# Patient Record
Sex: Male | Born: 1995 | Race: White | Hispanic: No | Marital: Single | Smoking: Current every day smoker
Health system: Southern US, Community
[De-identification: ages and names within clinical notes are randomized; demographics above are authoritative.]

## PROBLEM LIST (undated history)

## (undated) DIAGNOSIS — F319 Bipolar disorder, unspecified: Secondary | ICD-10-CM

## (undated) DIAGNOSIS — F3181 Bipolar II disorder: Secondary | ICD-10-CM

## (undated) DIAGNOSIS — R569 Unspecified convulsions: Secondary | ICD-10-CM

## (undated) DIAGNOSIS — F840 Autistic disorder: Secondary | ICD-10-CM

## (undated) DIAGNOSIS — F909 Attention-deficit hyperactivity disorder, unspecified type: Secondary | ICD-10-CM

## (undated) HISTORY — PX: HIP SURGERY: SHX245

---
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
## 2017-05-31 ENCOUNTER — Ambulatory Visit: Payer: Medicaid Other | Admitting: Family Medicine

## 2017-07-19 ENCOUNTER — Encounter (HOSPITAL_COMMUNITY): Payer: Self-pay | Admitting: *Deleted

## 2017-07-19 ENCOUNTER — Emergency Department (HOSPITAL_COMMUNITY): Payer: Medicaid Other

## 2017-07-19 ENCOUNTER — Emergency Department (HOSPITAL_COMMUNITY)
Admission: EM | Admit: 2017-07-19 | Discharge: 2017-07-19 | Disposition: A | Discharge status: Home / Self Care | Payer: Medicaid Other | Attending: Emergency Medicine | Admitting: Emergency Medicine

## 2017-07-19 DIAGNOSIS — G4489 Other headache syndrome: Secondary | ICD-10-CM | POA: Diagnosis not present

## 2017-07-19 DIAGNOSIS — R072 Precordial pain: Secondary | ICD-10-CM | POA: Diagnosis not present

## 2017-07-19 DIAGNOSIS — F17210 Nicotine dependence, cigarettes, uncomplicated: Secondary | ICD-10-CM | POA: Diagnosis not present

## 2017-07-19 DIAGNOSIS — R1084 Generalized abdominal pain: Secondary | ICD-10-CM | POA: Diagnosis present

## 2017-07-19 DIAGNOSIS — E860 Dehydration: Secondary | ICD-10-CM | POA: Insufficient documentation

## 2017-07-19 DIAGNOSIS — F1210 Cannabis abuse, uncomplicated: Secondary | ICD-10-CM | POA: Diagnosis not present

## 2017-07-19 LAB — URINALYSIS, ROUTINE W REFLEX MICROSCOPIC
BACTERIA UA: NONE SEEN
Bilirubin Urine: NEGATIVE
GLUCOSE, UA: NEGATIVE mg/dL
KETONES UR: 5 mg/dL — AB
Leukocytes, UA: NEGATIVE
NITRITE: NEGATIVE
PROTEIN: 30 mg/dL — AB
SQUAMOUS EPITHELIAL / LPF: NONE SEEN
Specific Gravity, Urine: 1.03 (ref 1.005–1.030)
pH: 5 (ref 5.0–8.0)

## 2017-07-19 LAB — I-STAT CHEM 8, ED
BUN: 14 mg/dL (ref 6–20)
CHLORIDE: 102 mmol/L (ref 101–111)
Calcium, Ion: 1.13 mmol/L — ABNORMAL LOW (ref 1.15–1.40)
Creatinine, Ser: 1 mg/dL (ref 0.61–1.24)
GLUCOSE: 94 mg/dL (ref 65–99)
HEMATOCRIT: 42 % (ref 39.0–52.0)
Hemoglobin: 14.3 g/dL (ref 13.0–17.0)
POTASSIUM: 3.3 mmol/L — AB (ref 3.5–5.1)
SODIUM: 141 mmol/L (ref 135–145)
TCO2: 27 mmol/L (ref 22–32)

## 2017-07-19 LAB — RAPID URINE DRUG SCREEN, HOSP PERFORMED
AMPHETAMINES: NOT DETECTED
BENZODIAZEPINES: NOT DETECTED
Barbiturates: NOT DETECTED
Cocaine: NOT DETECTED
OPIATES: NOT DETECTED
TETRAHYDROCANNABINOL: POSITIVE — AB

## 2017-07-19 MED ORDER — ALBUTEROL SULFATE HFA 108 (90 BASE) MCG/ACT IN AERS
2.0000 | INHALATION_SPRAY | Freq: Once | RESPIRATORY_TRACT | Status: DC
  Filled 2017-07-19: qty 6.7

## 2017-07-19 NOTE — Progress Notes (Signed)
Notified Dr. Bebe Shaggy of pt refusal

## 2017-07-19 NOTE — ED Provider Notes (Signed)
[facility]-EMERGENCY DEPT Provider Note   CSN: [id number] Arrival date & time: 07/19/17  0042     History   Chief Complaint Chief Complaint  Patient presents with  . Abdominal Pain    HPI Jeffrey Hunt is a 21 y.o. male.  The history is provided by the patient.  Abdominal Pain   This is a chronic problem. Episode onset: unknown. The problem has not changed since onset.The pain is located in the generalized abdominal region. The pain is mild. Associated symptoms include diarrhea and headaches. Pertinent negatives include fever and vomiting. The symptoms are aggravated by certain positions. Nothing relieves the symptoms.  Patient with presents with multiple complaints:  1. He reports pain throughout his abdomen, unknown when this started.  He reports recent nonbloody diarrhea 2. He reports pain on both sides of chest, but mostly on right side of his chest and he points to one spot that hurts the most. 3. When asked about headache he states "where do I begin" and tells me he has headaches due to other people. 4. He also reports back pain 5.he also reports shortness of breath with exertion  He reports smoking marijuana  He denies SI but reports told nurse he has thought of it previously but denies active SI or suicidal plan at this time  No recent falls/trauma Past Medical History:  Diagnosis Date  . ADHD (attention deficit hyperactivity disorder)   . Bipolar 1 disorder (HCC)   . Seizures (HCC)    when younger    Patient Active Problem List   Diagnosis Date Noted  . Allergic rhinitis 02/07/2013  . Mood disorder (HCC) 02/07/2013  . ADHD (attention deficit hyperactivity disorder) 02/07/2013    History reviewed. No pertinent surgical history.     Home Medications    Prior to Admission medications   Medication Sig Start Date End Date Taking? Authorizing Provider  ibuprofen (ADVIL,MOTRIN) 600 MG tablet Take 1 tablet (600 mg total) by mouth every 6 (six) hours as needed.  10/31/16   Ivery Quale, PA-C    Family History History reviewed. No pertinent family history.  Social History Social History  Substance Use Topics  . Smoking status: Current Every Day Smoker    Packs/day: 0.50    Types: Cigarettes  . Smokeless tobacco: Former Neurosurgeon    Types: Snuff  . Alcohol use No     Allergies   Stimulant laxative [bisacodyl]   Review of Systems Review of Systems  Constitutional: Negative for fever.  Respiratory: Positive for shortness of breath.   Cardiovascular: Positive for chest pain.  Gastrointestinal: Positive for abdominal pain and diarrhea. Negative for vomiting.  Neurological: Positive for headaches.  Psychiatric/Behavioral: Negative for suicidal ideas.  All other systems reviewed and are negative.    Physical Exam Updated Vital Signs BP 118/74 (BP Location: Left Arm)   Pulse (!) 102   Temp 98.3 F (36.8 C) (Oral)   Resp 18   Ht 1.778 m ( )   SpO2 95%   Physical Exam CONSTITUTIONAL: Well developed/well nourished HEAD: Normocephalic/atraumatic EYES: EOMI/PERRL, no icterus ENMT: Mucous membranes moist NECK: supple no meningeal signs SPINE/BACK:entire spine nontender CV: S1/S2 noted, no murmurs/rubs/gallops noted LUNGS: wheezing bilaterally, no distress noted Chest - mild diffuse tenderness, no bruising or crepitus ABDOMEN: soft, nontender, no rebound or guarding, bowel sounds noted throughout abdomen GU:no cva tenderness NEURO: Pt is awake/alert/appropriate, moves all extremitiesx4.  No facial droop.  He ambulates without difficulty, no ataxia noted EXTREMITIES: pulses normal/equal, full ROM, no  LE edema or calf tenderness SKIN: warm, color normal PSYCH: no abnormalities of mood noted, alert and oriented to situation   ED Treatments / Results  Labs (all labs ordered are listed, but only abnormal results are displayed) Labs Reviewed  RAPID URINE DRUG SCREEN, HOSP PERFORMED - Abnormal; Notable for the following:        Result Value   Tetrahydrocannabinol POSITIVE (*)    All other components within normal limits  URINALYSIS, ROUTINE W REFLEX MICROSCOPIC - Abnormal; Notable for the following:    APPearance HAZY (*)    Hgb urine dipstick LARGE (*)    Ketones, ur 5 (*)    Protein, ur 30 (*)    All other components within normal limits  I-STAT CHEM 8, ED - Abnormal; Notable for the following:    Potassium 3.3 (*)    Calcium, Ion 1.13 (*)    All other components within normal limits    EKG  EKG Interpretation  Date/Time:  Tuesday July 19 2017 01:25:17 EDT Ventricular Rate:  96 PR Interval:    QRS Duration: 84 QT Interval:  320 QTC Calculation: 405 R Axis:   87 Text Interpretation:  Sinus rhythm Consider left atrial enlargement No significant change since last tracing Confirmed by Zadie Rhine ([id number]) on 07/19/2017 1:57:33 AM       Radiology Dg Chest 2 View  Result Date: 07/19/2017 CLINICAL DATA:  Cough, dyspnea and chest pain tonight. EXAM: CHEST  2 VIEW COMPARISON:  08/17/2005 FINDINGS: Linear opacities in both bases may represent scarring or atelectasis. No confluent airspace consolidation. No pleural effusion. Normal hilar, mediastinal and cardiac contours. IMPRESSION: Linear lung base opacities bilaterally, scarring versus atelectasis. No confluent consolidation. Electronically Signed   By: Ellery Plunk M.D.   On: 07/19/2017 02:21    Procedures Procedures  Medications Ordered in ED Medications  albuterol (PROVENTIL HFA;VENTOLIN HFA) 108 (90 Base) MCG/ACT inhaler 2 puff (2 puffs Inhalation Not Given 07/19/17 0153)     Initial Impression / Assessment and Plan / ED Course  I have reviewed the triage vital signs and the nursing notes.  Pertinent labs & imaging results that were available during my care of the patient were reviewed by me and considered in my medical decision making (see chart for details).     2:00 AM Pt with multiple complaints Overall, he is well  appearing Anticipate discharge once labs/imaging result He admits he just wanted to "get checked out" to make sure he didn't have cancer Advised he can f/u as outpatient for further testing Pt refused albuterol, states it gives him rash  Advised PCP followup Told him to increase PO fluids as he is dehydrated Also advised he may have nephrolithiasis causing hematuria, though no acute pain at this time  Final Clinical Impressions(s) / ED Diagnoses   Final diagnoses:  Precordial pain  Dehydration  Other headache syndrome  Marijuana abuse    New Prescriptions New Prescriptions   No medications on file     Zadie Rhine, MD 07/19/17 [phone]

## 2017-07-19 NOTE — ED Triage Notes (Signed)
Pt has multiple complaints; pt c/o left side rib pain and lower back pain for the last 3 days

## 2017-07-19 NOTE — Progress Notes (Signed)
Pt refused inhaler. Pt states that he is allergic to the medication. He states his face breaks out.

## 2017-10-31 ENCOUNTER — Emergency Department (HOSPITAL_COMMUNITY)
Admission: EM | Admit: 2017-10-31 | Discharge: 2017-10-31 | Disposition: A | Discharge status: Home / Self Care | Payer: Medicaid Other | Attending: Emergency Medicine | Admitting: Emergency Medicine

## 2017-10-31 ENCOUNTER — Encounter (HOSPITAL_COMMUNITY): Payer: Self-pay | Admitting: Emergency Medicine

## 2017-10-31 ENCOUNTER — Other Ambulatory Visit: Payer: Self-pay

## 2017-10-31 DIAGNOSIS — Z5321 Procedure and treatment not carried out due to patient leaving prior to being seen by health care provider: Secondary | ICD-10-CM | POA: Diagnosis not present

## 2017-10-31 DIAGNOSIS — R42 Dizziness and giddiness: Secondary | ICD-10-CM | POA: Insufficient documentation

## 2017-10-31 LAB — BASIC METABOLIC PANEL
Anion gap: 5 (ref 5–15)
BUN: 11 mg/dL (ref 6–20)
CHLORIDE: 102 mmol/L (ref 101–111)
CO2: 30 mmol/L (ref 22–32)
Calcium: 8.9 mg/dL (ref 8.9–10.3)
Creatinine, Ser: 1.15 mg/dL (ref 0.61–1.24)
GFR calc Af Amer: >60 mL/min (ref >60)
GFR calc non Af Amer: >60 mL/min (ref >60)
GLUCOSE: 98 mg/dL (ref 65–99)
POTASSIUM: 5.7 mmol/L — AB (ref 3.5–5.1)
Sodium: 137 mmol/L (ref 135–145)

## 2017-10-31 LAB — CBC
HEMATOCRIT: 45.8 % (ref 39.0–52.0)
Hemoglobin: 15.2 g/dL (ref 13.0–17.0)
MCH: 28.5 pg (ref 26.0–34.0)
MCHC: 33.2 g/dL (ref 30.0–36.0)
MCV: 85.8 fL (ref 78.0–100.0)
Platelets: 180 10*3/uL (ref 150–400)
RBC: 5.34 MIL/uL (ref 4.22–5.81)
RDW: 13.5 % (ref 11.5–15.5)
WBC: 5.3 10*3/uL (ref 4.0–10.5)

## 2017-10-31 LAB — CBG MONITORING, ED: Glucose-Capillary: 95 mg/dL (ref 65–99)

## 2017-10-31 NOTE — ED Triage Notes (Signed)
Pt verbalizes lightheadedness/dizziness while walking into [employer]; verbalize "blood pressure and pulse up." Pt verbalizes blood pressure was 127 systolic and 114 heart rate.

## 2018-01-10 ENCOUNTER — Emergency Department (HOSPITAL_COMMUNITY): Payer: Medicaid Other

## 2018-01-10 ENCOUNTER — Emergency Department (HOSPITAL_COMMUNITY)
Admission: EM | Admit: 2018-01-10 | Discharge: 2018-01-10 | Disposition: A | Discharge status: Home / Self Care | Payer: Medicaid Other | Attending: Emergency Medicine | Admitting: Emergency Medicine

## 2018-01-10 ENCOUNTER — Other Ambulatory Visit: Payer: Self-pay

## 2018-01-10 ENCOUNTER — Encounter (HOSPITAL_COMMUNITY): Payer: Self-pay | Admitting: Emergency Medicine

## 2018-01-10 DIAGNOSIS — F17210 Nicotine dependence, cigarettes, uncomplicated: Secondary | ICD-10-CM | POA: Diagnosis not present

## 2018-01-10 DIAGNOSIS — R51 Headache: Secondary | ICD-10-CM | POA: Insufficient documentation

## 2018-01-10 DIAGNOSIS — S0990XA Unspecified injury of head, initial encounter: Secondary | ICD-10-CM

## 2018-01-10 HISTORY — DX: Bipolar II disorder: F31.81

## 2018-01-10 HISTORY — DX: Autistic disorder: F84.0

## 2018-01-10 MED ORDER — ACETAMINOPHEN 500 MG PO TABS
1000.0000 mg | ORAL_TABLET | Freq: Once | ORAL | Status: AC
  Administered 2018-01-10: 1000 mg via ORAL
  Filled 2018-01-10: qty 2

## 2018-01-10 NOTE — Discharge Instructions (Signed)
Take Tylenol as directed for pain.  Call the local health department to get a primary care physician.  Ask your primary care physician to help you to stop smoking

## 2018-01-10 NOTE — ED Triage Notes (Signed)
Onset Saturday, when working outside, pt stood up and hit top of head on an air conditioner. Continue to have headache and vomiting.

## 2018-01-10 NOTE — ED Notes (Signed)
Patient transported to CT 

## 2018-01-10 NOTE — ED Provider Notes (Signed)
[facility] EMERGENCY DEPARTMENT Provider Note   CSN: [id number] Arrival date & time: 01/10/18  1457     History   Chief Complaint Chief Complaint  Patient presents with  . Head Injury    HPI Jeffrey Hunt is a 22 y.o. male.  Patient stood up from a squatting position hitting the top of his head on an air conditioner wall unit 3 nights ago.  He developed a severe headache at the top of his head the following morning accompanied by vomiting 2 or 3 times.  Headache is gotten progressively worse since its onset.  Nothing makes symptoms better or worse.  No treatment prior to coming here he is not nauseated at present.  He denies neck pain denies focal numbness or weakness denies visual changes or changes in hearing.  No other associated symptoms  HPI  Past Medical History:  Diagnosis Date  . ADHD (attention deficit hyperactivity disorder)   . Autistic disorder   . Bipolar 1 disorder (HCC)   . Bipolar 2 disorder (HCC)   . Seizures (HCC)    when younger    Patient Active Problem List   Diagnosis Date Noted  . Allergic rhinitis 02/07/2013  . Mood disorder (HCC) 02/07/2013  . ADHD (attention deficit hyperactivity disorder) 02/07/2013    History reviewed. No pertinent surgical history.      Home Medications    Prior to Admission medications   Medication Sig Start Date End Date Taking? Authorizing Provider  ibuprofen (ADVIL,MOTRIN) 600 MG tablet Take 1 tablet (600 mg total) by mouth every 6 (six) hours as needed. 10/31/16   Ivery Quale, PA-C    Family History No family history on file.  Social History Social History   Tobacco Use  . Smoking status: Current Every Day Smoker    Packs/day: 0.25    Types: Cigarettes  . Smokeless tobacco: Former Neurosurgeon    Types: Snuff  Substance Use Topics  . Alcohol use: No  . Drug use: Not Currently    Comment: 4 months ago     Allergies   Stimulant laxative [bisacodyl]   Review of Systems Review of Systems    Constitutional: Negative.   HENT: Negative.   Respiratory: Negative.   Cardiovascular: Negative.   Gastrointestinal: Positive for vomiting.  Musculoskeletal: Negative.   Skin: Negative.   Neurological: Positive for headaches.  Psychiatric/Behavioral: Negative.      Physical Exam Updated Vital Signs BP 126/80 (BP Location: Right Arm)   Pulse 70   Temp 98.1 F (36.7 C) (Oral)   Resp 18   Ht 5\' 9"  (1.753 m)   Wt 62.1 kg (137 lb)   SpO2 99%   BMI 20.23 kg/m   Physical Exam  Constitutional: He is oriented to person, place, and time. He appears well-developed and well-nourished. No distress.  HENT:  Head: Normocephalic and atraumatic.  Right Ear: External ear normal.  Left Ear: External ear normal.  Nose: Nose normal.  Bilateral tympanic membranes normal  Eyes: Pupils are equal, round, and reactive to light. Conjunctivae and EOM are normal.  Neck: Neck supple. No tracheal deviation present. No thyromegaly present.  No tenderness over cervical spine  Cardiovascular: Normal rate, regular rhythm and intact distal pulses.  No murmur heard. Pulmonary/Chest: Effort normal and breath sounds normal.  Abdominal: Soft. Bowel sounds are normal. He exhibits no distension. There is no tenderness.  Musculoskeletal: Normal range of motion. He exhibits no edema or tenderness.  All 4 extremities nontender, neurovascular intact  Neurological:  He is alert and oriented to person, place, and time. Coordination normal.  Finger to nose normal motor strength 5/5 overall gait normal Romberg normal pronator drift normal.  Cranial nerves II through XII grossly intact  Skin: Skin is warm and dry. No rash noted.  Psychiatric: He has a normal mood and affect.  Nursing note and vitals reviewed.    ED Treatments / Results  Labs (all labs ordered are listed, but only abnormal results are displayed) Labs Reviewed - No data to display  EKG None  Radiology No results found. Results for orders  placed or performed during the hospital encounter of 07/19/17  Rapid urine drug screen (hospital performed)  Result Value Ref Range   Opiates NONE DETECTED NONE DETECTED   Cocaine NONE DETECTED NONE DETECTED   Benzodiazepines NONE DETECTED NONE DETECTED   Amphetamines NONE DETECTED NONE DETECTED   Tetrahydrocannabinol POSITIVE (A) NONE DETECTED   Barbiturates NONE DETECTED NONE DETECTED  Urinalysis, Routine w reflex microscopic  Result Value Ref Range   Color, Urine YELLOW YELLOW   APPearance HAZY (A) CLEAR   Specific Gravity, Urine 1.030 1.005 - 1.030   pH 5.0 5.0 - 8.0   Glucose, UA NEGATIVE NEGATIVE mg/dL   Hgb urine dipstick LARGE (A) NEGATIVE   Bilirubin Urine NEGATIVE NEGATIVE   Ketones, ur 5 (A) NEGATIVE mg/dL   Protein, ur 30 (A) NEGATIVE mg/dL   Nitrite NEGATIVE NEGATIVE   Leukocytes, UA NEGATIVE NEGATIVE   RBC / HPF TOO NUMEROUS TO COUNT 0 - 5 RBC/hpf   WBC, UA 0-5 0 - 5 WBC/hpf   Bacteria, UA NONE SEEN NONE SEEN   Squamous Epithelial / LPF NONE SEEN NONE SEEN   Mucus PRESENT    Ca Oxalate Crys, UA PRESENT   I-stat chem 8, ed  Result Value Ref Range   Sodium 141 135 - 145 mmol/L   Potassium 3.3 (L) 3.5 - 5.1 mmol/L   Chloride 102 101 - 111 mmol/L   BUN 14 6 - 20 mg/dL   Creatinine, Ser 9.601.00 0.61 - 1.24 mg/dL   Glucose, Bld 94 65 - 99 mg/dL   Calcium, Ion 4.541.13 (L) 1.15 - 1.40 mmol/L   TCO2 27 22 - 32 mmol/L   Hemoglobin 14.3 13.0 - 17.0 g/dL   HCT 09.842.0 11.939.0 - 14.752.0 %   Ct Head Wo Contrast  Result Date: 01/10/2018 CLINICAL DATA:  Posttraumatic headache EXAM: CT HEAD WITHOUT CONTRAST TECHNIQUE: Contiguous axial images were obtained from the base of the skull through the vertex without intravenous contrast. COMPARISON:  None. FINDINGS: Brain: No evidence of acute infarction, hemorrhage, hydrocephalus, extra-axial collection or mass lesion/mass effect. Vascular: No hyperdense vessel or unexpected calcification. Skull: No osseous abnormality. Sinuses/Orbits: Visualized  paranasal sinuses are clear. Visualized mastoid sinuses are clear. Visualized orbits demonstrate no focal abnormality. Other: None IMPRESSION: No acute intracranial pathology. Electronically Signed   By: Elige KoHetal  Patel   On: 01/10/2018 16:28   Procedures Procedures (including critical care time)  Medications Ordered in ED Medications  acetaminophen (TYLENOL) tablet 1,000 mg (1,000 mg Oral Given 01/10/18 1520)     Initial Impression / Assessment and Plan / ED Course  I have reviewed the triage vital signs and the nursing notes.  Pertinent labs & imaging results that were available during my care of the patient were reviewed by me and considered in my medical decision making (see chart for details).     4:50 PM pain improved after treatment with Tylenol.  He is alert  ambulatory Glasgow Coma Score 15 and appears in no distress Symptoms consistent with mild concussion.  Plan Tylenol.  I counseled patient for 5 minutes on smoking cessation.  Referral primary care patient does not play any contact sports and is not at any increased risk for reinjury Final Clinical Impressions(s) / ED Diagnoses  Diagnosis Minor closed head injury with mild concussion Final diagnoses:  None    ED Discharge Orders    None       Doug Sou, MD 01/10/18 2214

## 2018-01-15 IMAGING — DX DG FOOT COMPLETE 3+V*R*
3 series · 3 of 3 positions shown · non-contrast
Comparison: Right foot radiographs performed 10/22/2010

CLINICAL DATA: Laceration to the right foot after kicking [employer]. Initial encounter.

EXAM:
RIGHT FOOT COMPLETE - 3+ VIEW

[foot ap]
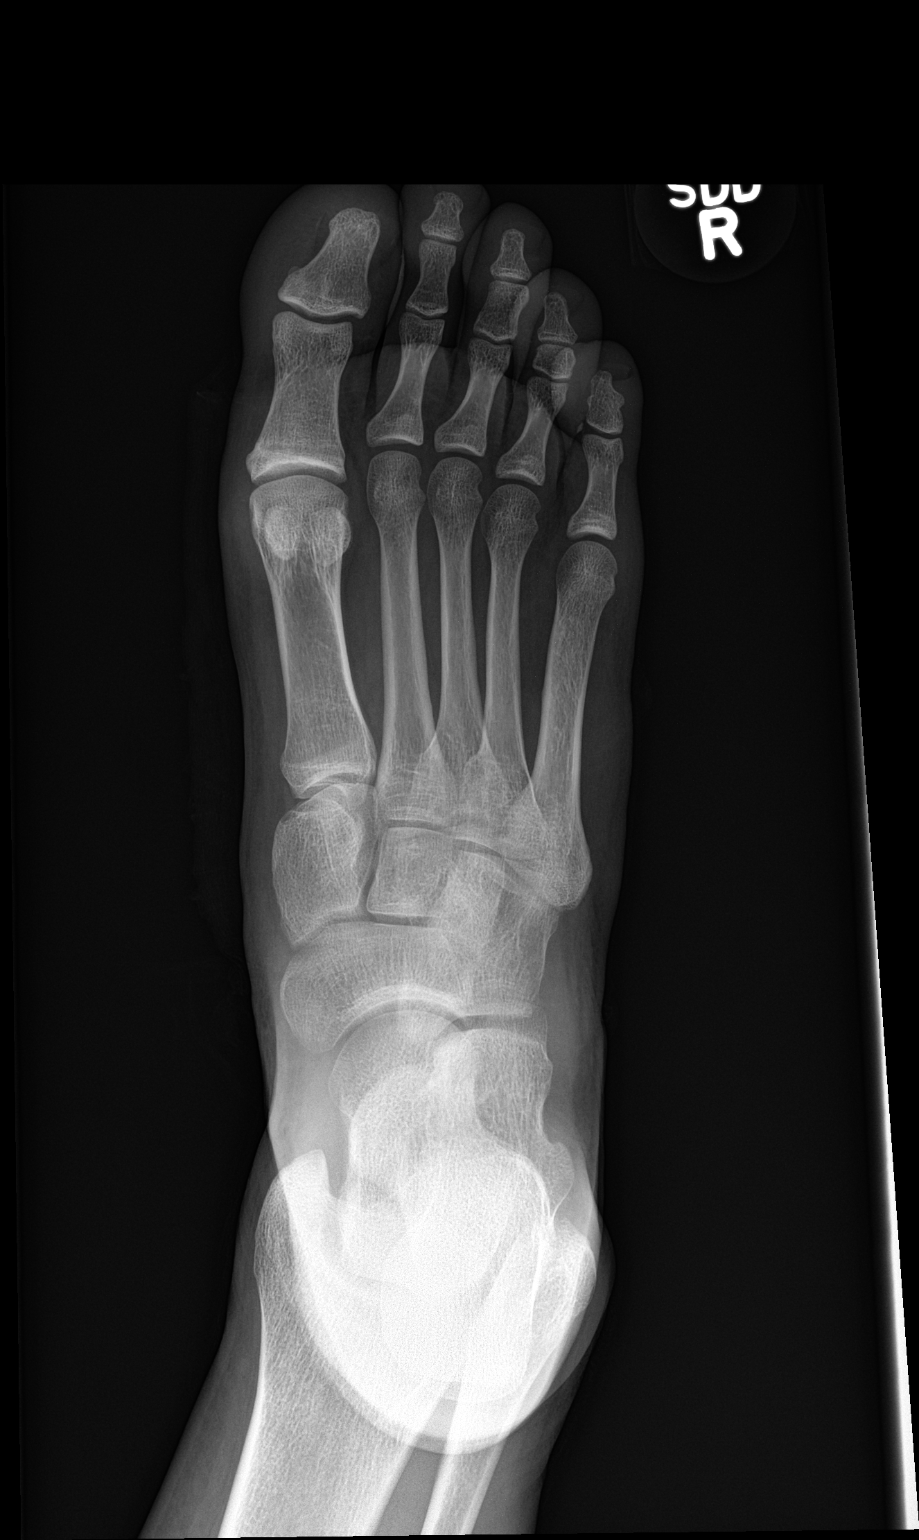

[foot obl]
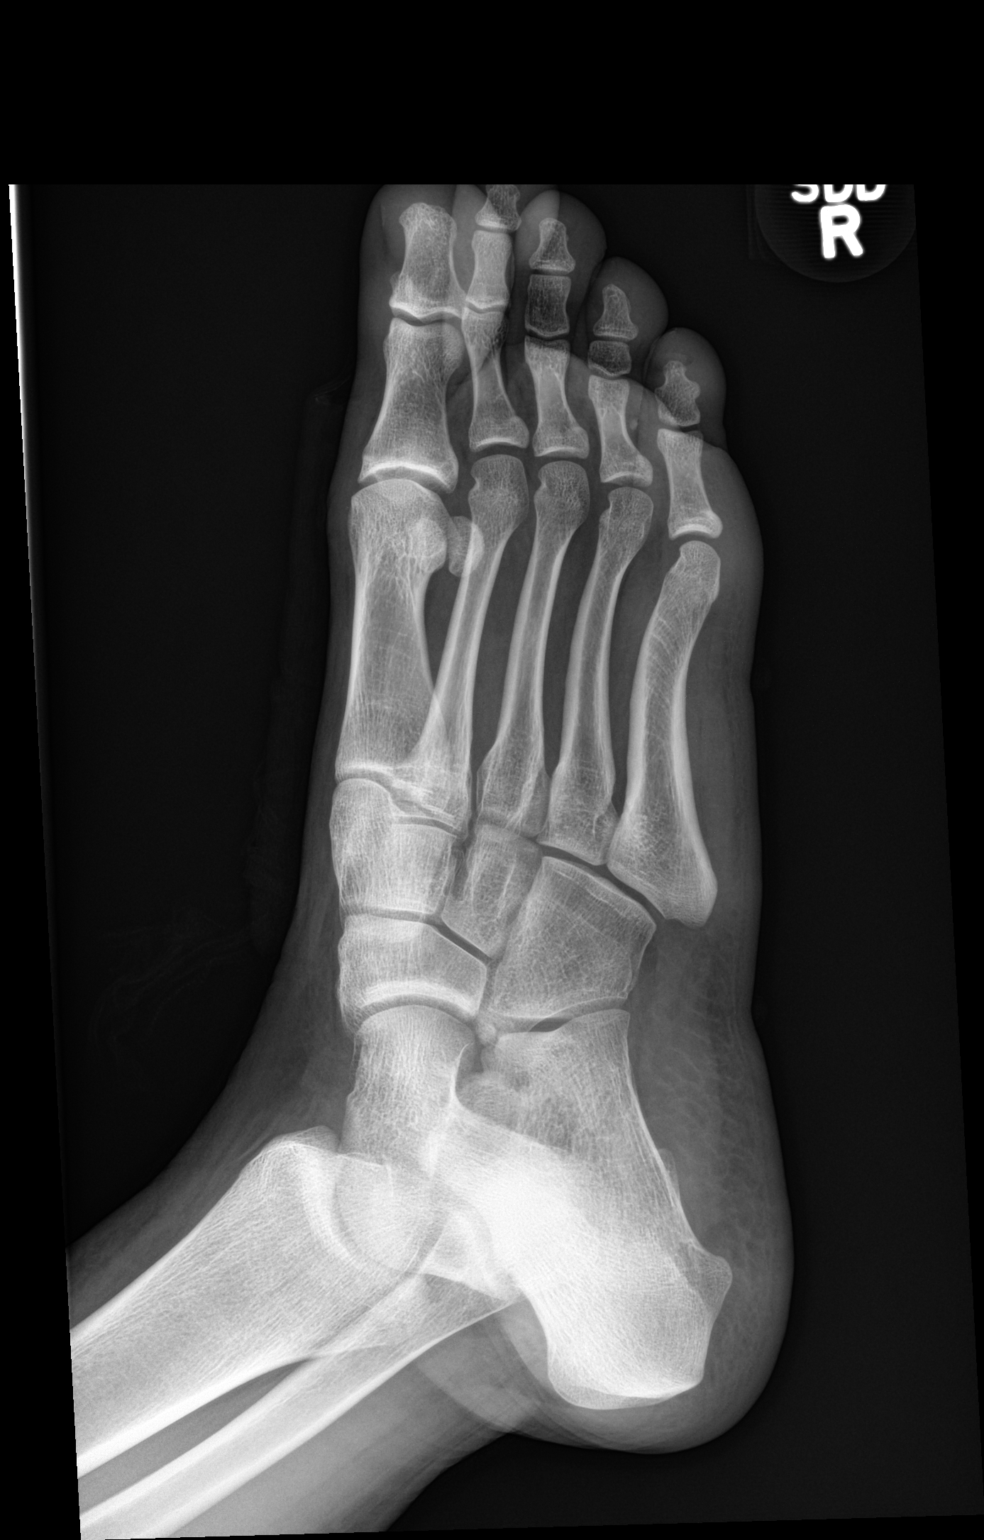

[foot lat]
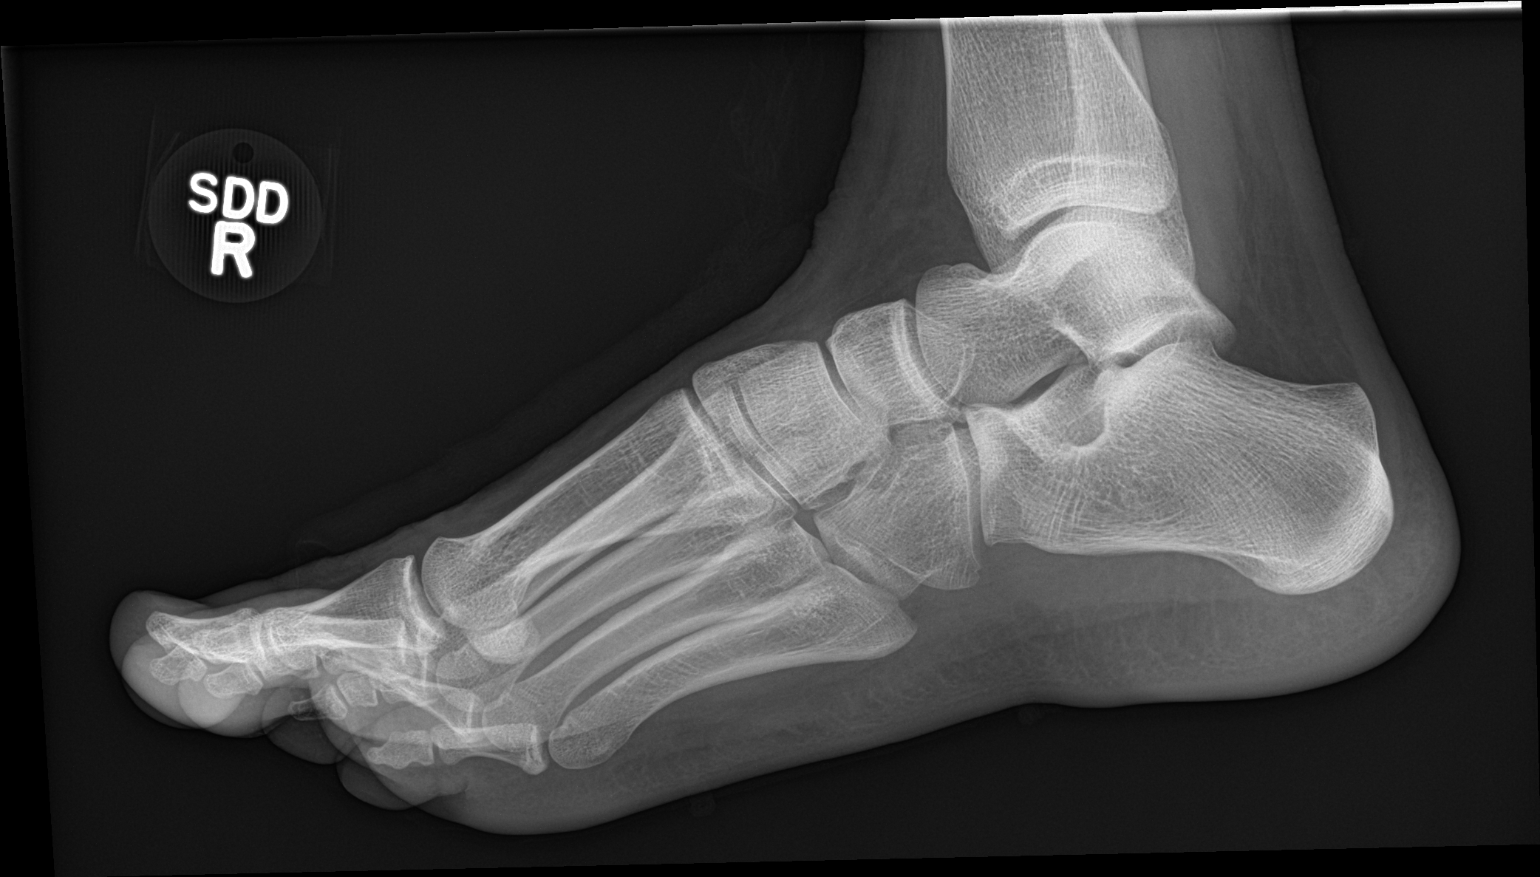

[3 of 3 positions shown; findings below may reference images not displayed]

FINDINGS: There is no evidence of fracture or dislocation. The joint spaces
are preserved. There is no evidence of talar subluxation; the
subtalar joint is unremarkable in appearance.

The known soft tissue laceration is not well characterized. No
radiopaque foreign bodies are seen.
IMPRESSION: No evidence of fracture or dislocation. No radiopaque foreign bodies
seen.

## 2018-03-06 ENCOUNTER — Emergency Department (HOSPITAL_COMMUNITY): Payer: Medicaid Other

## 2018-03-06 ENCOUNTER — Encounter (HOSPITAL_COMMUNITY): Payer: Self-pay | Admitting: *Deleted

## 2018-03-06 ENCOUNTER — Other Ambulatory Visit: Payer: Self-pay

## 2018-03-06 ENCOUNTER — Emergency Department (HOSPITAL_COMMUNITY)
Admission: EM | Admit: 2018-03-06 | Discharge: 2018-03-06 | Disposition: A | Discharge status: Home / Self Care | Payer: Medicaid Other | Attending: Emergency Medicine | Admitting: Emergency Medicine

## 2018-03-06 DIAGNOSIS — F17210 Nicotine dependence, cigarettes, uncomplicated: Secondary | ICD-10-CM | POA: Diagnosis not present

## 2018-03-06 DIAGNOSIS — F319 Bipolar disorder, unspecified: Secondary | ICD-10-CM | POA: Insufficient documentation

## 2018-03-06 DIAGNOSIS — F909 Attention-deficit hyperactivity disorder, unspecified type: Secondary | ICD-10-CM | POA: Insufficient documentation

## 2018-03-06 DIAGNOSIS — F840 Autistic disorder: Secondary | ICD-10-CM | POA: Insufficient documentation

## 2018-03-06 DIAGNOSIS — Z79899 Other long term (current) drug therapy: Secondary | ICD-10-CM | POA: Insufficient documentation

## 2018-03-06 DIAGNOSIS — R109 Unspecified abdominal pain: Secondary | ICD-10-CM | POA: Insufficient documentation

## 2018-03-06 LAB — COMPREHENSIVE METABOLIC PANEL
ALK PHOS: 77 U/L (ref 38–126)
ALT: 12 U/L — ABNORMAL LOW (ref 17–63)
ANION GAP: 9 (ref 5–15)
AST: 28 U/L (ref 15–41)
Albumin: 4.7 g/dL (ref 3.5–5.0)
BILIRUBIN TOTAL: 1 mg/dL (ref 0.3–1.2)
BUN: 13 mg/dL (ref 6–20)
CALCIUM: 9.4 mg/dL (ref 8.9–10.3)
CO2: 27 mmol/L (ref 22–32)
Chloride: 102 mmol/L (ref 101–111)
Creatinine, Ser: 0.99 mg/dL (ref 0.61–1.24)
Glucose, Bld: 97 mg/dL (ref 65–99)
Potassium: 3.7 mmol/L (ref 3.5–5.1)
Sodium: 138 mmol/L (ref 135–145)
TOTAL PROTEIN: 7.4 g/dL (ref 6.5–8.1)

## 2018-03-06 LAB — LIPASE, BLOOD: Lipase: 33 U/L (ref 11–51)

## 2018-03-06 LAB — CBC
HCT: 44.4 % (ref 39.0–52.0)
HEMOGLOBIN: 15 g/dL (ref 13.0–17.0)
MCH: 29.8 pg (ref 26.0–34.0)
MCHC: 33.8 g/dL (ref 30.0–36.0)
MCV: 88.3 fL (ref 78.0–100.0)
Platelets: 159 10*3/uL (ref 150–400)
RBC: 5.03 MIL/uL (ref 4.22–5.81)
RDW: 13.4 % (ref 11.5–15.5)
WBC: 9.5 10*3/uL (ref 4.0–10.5)

## 2018-03-06 LAB — URINALYSIS, ROUTINE W REFLEX MICROSCOPIC
Bilirubin Urine: NEGATIVE
Glucose, UA: NEGATIVE mg/dL
Hgb urine dipstick: NEGATIVE
Ketones, ur: 5 mg/dL — AB
LEUKOCYTES UA: NEGATIVE
NITRITE: NEGATIVE
Protein, ur: NEGATIVE mg/dL
SPECIFIC GRAVITY, URINE: 1.025 (ref 1.005–1.030)
pH: 5 (ref 5.0–8.0)

## 2018-03-06 LAB — RAPID URINE DRUG SCREEN, HOSP PERFORMED
Amphetamines: NOT DETECTED
BENZODIAZEPINES: NOT DETECTED
Barbiturates: NOT DETECTED
COCAINE: NOT DETECTED
OPIATES: NOT DETECTED
TETRAHYDROCANNABINOL: POSITIVE — AB

## 2018-03-06 MED ORDER — DOXYCYCLINE HYCLATE 100 MG PO CAPS: 100.0000 mg | ORAL_CAPSULE | Freq: Two times a day (BID) | ORAL | 0 refills | Status: DC

## 2018-03-06 MED ORDER — KETOROLAC TROMETHAMINE 30 MG/ML IJ SOLN
15.0000 mg | Freq: Once | INTRAMUSCULAR | Status: AC
  Administered 2018-03-06: 15 mg via INTRAVENOUS
  Filled 2018-03-06: qty 1

## 2018-03-06 MED ORDER — SODIUM CHLORIDE 0.9 % IV BOLUS
1000.0000 mL | Freq: Once | INTRAVENOUS | Status: AC
  Administered 2018-03-06: 1000 mL via INTRAVENOUS

## 2018-03-06 MED ORDER — IOPAMIDOL (ISOVUE-300) INJECTION 61%
100.0000 mL | Freq: Once | INTRAVENOUS | Status: AC | PRN
  Administered 2018-03-06: 100 mL via INTRAVENOUS

## 2018-03-06 NOTE — ED Notes (Signed)
Patient transported to CT 

## 2018-03-06 NOTE — Discharge Instructions (Addendum)
Take ibuprofen for pain. Avoid acetaminophen for now. Follow up with the health department for the results of your hepatitis screen.Take the antibiotics until gone. Return to the ED if you get a fever, vomiting or seem worse.

## 2018-03-06 NOTE — ED Triage Notes (Signed)
Pt c/o right side abdominal pain for "a while"; pt states he has been vomiting

## 2018-03-06 NOTE — ED Provider Notes (Signed)
[facility] EMERGENCY DEPARTMENT Provider Note   CSN: [id number] Arrival date & time: 03/06/18  0865  Time seen 05:39 AM   History   Chief Complaint Chief Complaint  Patient presents with  . Abdominal Pain    HPI Jeffrey Hunt is a 22 y.o. male.  HPI patient states he is having lateral right sided abdominal pain that is "killing me" for a few days or up to a week.  He states it got worse tonight.  The pain is been there constantly.  The process of sitting up makes the pain worse, nothing else does.  Nothing makes it feel better.  He tried 2 Tylenol yesterday without improvement.  He has had nausea and vomiting about 4-5 times a day.  He denies diarrhea.  He states it hurts in his right side when he urinates.  He denies hematuria or fever.  He also states he has had a cough for a few months and produces phlegm but he does not look at.  He states he is short of breath all the time.  Patient states he has lack of energy.  He states his pain is different from when he had kidney stones.  PC[facility], [facility]   Past Medical History:  Diagnosis Date  . ADHD (attention deficit hyperactivity disorder)   . Autistic disorder   . Bipolar 1 disorder (HCC)   . Bipolar 2 disorder (HCC)   . Seizures (HCC)    when younger    Patient Active Problem List   Diagnosis Date Noted  . Allergic rhinitis 02/07/2013  . Mood disorder (HCC) 02/07/2013  . ADHD (attention deficit hyperactivity disorder) 02/07/2013    History reviewed. No pertinent surgical history.      Home Medications    Prior to Admission medications   Medication Sig Start Date End Date Taking? Authorizing Provider  doxycycline (VIBRAMYCIN) 100 MG capsule Take 1 capsule (100 mg total) by mouth 2 (two) times daily. 03/06/18   [name], MD  ibuprofen (ADVIL,MOTRIN) 600 MG tablet Take 1 tablet (600 mg total) by mouth every 6 (six) hours as needed. 10/31/16   Ivery Quale, PA-C    Family History History  reviewed. No pertinent family history.  Social History Social History   Tobacco Use  . Smoking status: Current Every Day Smoker    Packs/day: 1.00    Types: Cigarettes  . Smokeless tobacco: Former Neurosurgeon    Types: Snuff  Substance Use Topics  . Alcohol use: Yes    Comment: occasionally  . Drug use: Yes    Comment: CBD weed  states he is graduating next week, he wants to get his PhD and be a scientist   Allergies   Stimulant laxative [bisacodyl]   Review of Systems Review of Systems  All other systems reviewed and are negative.    Physical Exam Updated Vital Signs BP 109/66 (BP Location: Left Arm)   Pulse 79   Temp 97.7 F (36.5 C) (Oral)   Resp 16   Ht  (1.753 m)   Wt 54.9 kg (121 lb)   SpO2 100%   BMI 17.87 kg/m   Vital signs normal    Physical Exam  Constitutional: He is oriented to person, place, and time. He appears well-developed and well-nourished.  Non-toxic appearance. He does not appear ill. No distress.  HENT:  Head: Normocephalic and atraumatic.  Right Ear: External ear normal.  Left Ear: External ear normal.  Nose: Nose normal. No mucosal edema  or rhinorrhea.  Mouth/Throat: Oropharynx is clear and moist and mucous membranes are normal. No dental abscesses or uvula swelling.  Poor teeth  Eyes: Pupils are equal, round, and reactive to light. Conjunctivae and EOM are normal.  Neck: Normal range of motion and full passive range of motion without pain. Neck supple.  Cardiovascular: Normal rate, regular rhythm and normal heart sounds. Exam reveals no gallop and no friction rub.  No murmur heard. Pulmonary/Chest: Effort normal and breath sounds normal. No respiratory distress. He has no wheezes. He has no rhonchi. He has no rales. He exhibits no tenderness and no crepitus.  Abdominal: Soft. Normal appearance and bowel sounds are normal. He exhibits no distension. There is no tenderness. There is no rebound and no guarding.    Area pain noted and  in the back in the same area.   Musculoskeletal: Normal range of motion. He exhibits no edema or tenderness.  Moves all extremities well.   Neurological: He is alert and oriented to person, place, and time. He has normal strength. No cranial nerve deficit.  Skin: Skin is warm, dry and intact. No rash noted. No erythema. No pallor.  Psychiatric: He has a normal mood and affect. His speech is normal and behavior is normal. His mood appears not anxious.  Nursing note and vitals reviewed.    ED Treatments / Results  Labs (all labs ordered are listed, but only abnormal results are displayed) Results for orders placed or performed during the hospital encounter of 03/06/18  Lipase, blood  Result Value Ref Range   Lipase 33 11 - 51 U/L  Comprehensive metabolic panel  Result Value Ref Range   Sodium 138 135 - 145 mmol/L   Potassium 3.7 3.5 - 5.1 mmol/L   Chloride 102 101 - 111 mmol/L   CO2 27 22 - 32 mmol/L   Glucose, Bld 97 65 - 99 mg/dL   BUN 13 6 - 20 mg/dL   Creatinine, Ser 6.96 0.61 - 1.24 mg/dL   Calcium 9.4 8.9 - 29.5 mg/dL   Total Protein 7.4 6.5 - 8.1 g/dL   Albumin 4.7 3.5 - 5.0 g/dL   AST 28 15 - 41 U/L   ALT 12 (L) 17 - 63 U/L   Alkaline Phosphatase 77 38 - 126 U/L   Total Bilirubin 1.0 0.3 - 1.2 mg/dL   GFR calc non Af Amer >60 >60 mL/min   GFR calc Af Amer >60 >60 mL/min   Anion gap 9 5 - 15  CBC  Result Value Ref Range   WBC 9.5 4.0 - 10.5 K/uL   RBC 5.03 4.22 - 5.81 MIL/uL   Hemoglobin 15.0 13.0 - 17.0 g/dL   HCT 28.4 13.2 - 44.0 %   MCV 88.3 78.0 - 100.0 fL   MCH 29.8 26.0 - 34.0 pg   MCHC 33.8 30.0 - 36.0 g/dL   RDW 10.2 72.5 - 36.6 %   Platelets 159 150 - 400 K/uL  Urinalysis, Routine w reflex microscopic  Result Value Ref Range   Color, Urine YELLOW YELLOW   APPearance CLEAR CLEAR   Specific Gravity, Urine 1.025 1.005 - 1.030   pH 5.0 5.0 - 8.0   Glucose, UA NEGATIVE NEGATIVE mg/dL   Hgb urine dipstick NEGATIVE NEGATIVE   Bilirubin Urine NEGATIVE  NEGATIVE   Ketones, ur 5 (A) NEGATIVE mg/dL   Protein, ur NEGATIVE NEGATIVE mg/dL   Nitrite NEGATIVE NEGATIVE   Leukocytes, UA NEGATIVE NEGATIVE   Laboratory interpretation all normal  EKG None  Radiology Ct Abdomen Pelvis W Contrast  Result Date: 03/06/2018 CLINICAL DATA:  Right-sided abdominal pain and vomiting EXAM: CT ABDOMEN AND PELVIS WITH CONTRAST TECHNIQUE: Multidetector CT imaging of the abdomen and pelvis was performed using the standard protocol following bolus administration of intravenous contrast. CONTRAST:  ISOVUE-300 IOPAMIDOL (ISOVUE-300) INJECTION 61% COMPARISON:  None. FINDINGS: Lower chest: There is atelectatic change in the medial left base. No consolidation in the lung bases. Hepatobiliary: There is a probable hemangioma in the anterior segment right lobe of the liver near the fissure for the ligamentum teres measuring 2.4 x 1.2 cm. There is a second area of increased attenuation in the left lobe of the liver measuring 5.6 x 3.2 cm. This lesion most likely also represents a hemangioma in this age group. No other focal liver lesion evident. There is mild periportal edema. Gallbladder is contracted. There is no biliary duct dilatation. Pancreas: There is no evident pancreatic mass or inflammatory focus. Spleen: No splenic lesions are evident. Adrenals/Urinary Tract: Adrenals bilaterally appear unremarkable. Kidneys bilaterally show no evident mass or hydronephrosis on either side. There is no appreciable renal or ureteral calculus on either side. Urinary bladder is midline thickening of the urinary bladder wall diffusely. Stomach/Bowel: There is no appreciable bowel wall or mesenteric thickening. No evident bowel obstruction. There is no appreciable free air or portal venous air. Vascular/Lymphatic: No abdominal aortic aneurysm. No vascular lesions are evident. There is no appreciable adenopathy in the abdomen or pelvis. Reproductive: Prostate and seminal vesicles appear  normal in size and contour. No pelvic mass lesions are evident. There is diffuse enhancement of the corpora cavernosa bilaterally. No mass or abnormal fluid seen in this area. Other: Appendix appears unremarkable. No abscess or ascites evident in the abdomen or pelvis. Musculoskeletal: There are no blastic or lytic bone lesions. There is a 7 x 7 mm cyst in the rectus muscle on the right anteriorly. IMPRESSION: 1.  Urinary bladder wall thickening.  Suspect a degree of cystitis. 2. Somewhat diffuse enhancement of the corpora cavernosa bilaterally. Etiology for this finding is uncertain. Penile inflammatory change is possible, although there is no wall thickening or evidence suggesting abnormal fluid/abscess. No mass seen. 3. No renal or ureteral calculus. No hydronephrosis. Prostate appears unremarkable. 4.  No bowel obstruction.  No abscess.  Appendix appears normal. 5. 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 There is a degree of periportal edema. Appropriate laboratory correlation to assess for possible underlying parenchymal liver disease advised. No biliary duct dilatation evident. 6. Small cyst in the rectus abdominus muscle on the right anteriorly. Electronically Signed   By: Bretta Bang III M.D.   On: 03/06/2018 07:14    Procedures Procedures (including critical care time)  Medications Ordered in ED Medications  sodium chloride 0.9 % bolus 1,000 mL (1,000 mLs Intravenous New Bag/Given 03/06/18 0618)  ketorolac (TORADOL) 30 MG/ML injection 15 mg (15 mg Intravenous Given 03/06/18 0618)  iopamidol (ISOVUE-300) 61 % injection 100 mL (100 mLs Intravenous Contrast Given 03/06/18 0653)     Initial Impression / Assessment and Plan / ED Course  I have reviewed the triage vital signs and the nursing notes.  Pertinent labs & imaging results that were available during my care of the patient were reviewed by me and considered in my medical decision making (see chart for details).    Patient was given IV  fluids and IV Toradol for his pain.  His pain is very atypical, the location is not classic for gallbladder  or kidney problems.  He does report increasing pain when he urinates.  CT scan was done with contrast because patient is very thin and if he does not have a kidney stone it would make other etiologies of his abdominal pain easier to see.  Pt left at change of shift to get results of his CT scan.   Patient's pain is better after the Toradol.  We discussed his CT result.  Hepatitis screen was added.  Patient states that "my liver should be clean".  We discussed that the hepatitis screen will not be back today, he needs to get a primary care doctor to follow-up with.  He is going to be put on antibiotics for possible cystitis although his urine looked normal.  He also is aware of the small cyst in his abdominal wall which I do not think would be the source of his pain.   Final Clinical Impressions(s) / ED Diagnoses   Final diagnoses:  Right sided abdominal pain    Disposition pending  [name], MD, Concha Pyo, MD 03/06/18 [phone]

## 2018-03-08 LAB — HEPATITIS PANEL, ACUTE
HEP A IGM: NEGATIVE
HEP B C IGM: NEGATIVE
HEP B S AG: NEGATIVE

## 2018-03-09 ENCOUNTER — Encounter (INDEPENDENT_AMBULATORY_CARE_PROVIDER_SITE_OTHER): Payer: Self-pay | Admitting: Internal Medicine

## 2018-03-09 ENCOUNTER — Encounter (INDEPENDENT_AMBULATORY_CARE_PROVIDER_SITE_OTHER): Payer: Self-pay | Admitting: *Deleted

## 2018-03-09 ENCOUNTER — Ambulatory Visit (INDEPENDENT_AMBULATORY_CARE_PROVIDER_SITE_OTHER): Payer: Medicaid Other | Admitting: Internal Medicine

## 2018-03-09 VITALS — BP 104/70 | HR 72 | Temp 98.2°F | Ht 69.0 in | Wt 122.0 lb

## 2018-03-09 DIAGNOSIS — R109 Unspecified abdominal pain: Secondary | ICD-10-CM

## 2018-03-09 NOTE — Patient Instructions (Signed)
Labs today

## 2018-03-09 NOTE — Telephone Encounter (Signed)
Error   This encounter was created in error - please disregard. 

## 2018-03-09 NOTE — Progress Notes (Signed)
   Subjective:    Patient ID: Jeffrey Hunt, male    DOB: Sep 06, 1996, 22 y.o.   MRN: [id number]  HPI Here today for f/u after recent visit to the ED on 03/06/2018 for rt abdominal pain. The pain was in his rt flank. Pain x 1 week. States the pain is not as bad. Rates pain 5/10. The pain is worse at night. Pain not related to eating. There has been no injury.  Sitting made the pain worse. When he voids he has pain. No blood in stool or fever. No problems voiding.  No burning on urination per patient.  States he had a kidney stone a couple of months ago and he has passed it.  No nausea or vomiting since hospital visit.  Hx of bipolar and autism.  Has a BM x 3-4 a day or more in a day. Stools are formed most of the time.   CT scan 1.  Urinary bladder wall thickening.  Suspect a degree of cystitis.  2. Somewhat diffuse enhancement of the corpora cavernosa bilaterally. Etiology for this finding is uncertain. Penile inflammatory change is possible, although there is no wall thickening or evidence suggesting abnormal fluid/abscess. No mass seen.  3. No renal or ureteral calculus. No hydronephrosis. Prostate appears unremarkable.  4.  No bowel obstruction.  No abscess.  Appendix appears normal.  5. 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 There is a degree of periportal edema. Appropriate laboratory correlation to assess for possible underlying parenchymal liver disease advised. No biliary duct dilatation evident.  6. Small cyst in the rectus abdominus muscle on the right    Review of Systems Past Medical History:  Diagnosis Date  . ADHD (attention deficit hyperactivity disorder)   . Autistic disorder   . Bipolar 1 disorder (HCC)   . Bipolar 2 disorder (HCC)   . Seizures (HCC)    when younger    History reviewed. No pertinent surgical history.  Allergies  Allergen Reactions  . Other     Stimulants. He almost died he states when he was younger  . Stimulant Laxative  [Bisacodyl] Other (See Comments)    Seizure/coma    Current Outpatient Medications on File Prior to Visit  Medication Sig Dispense Refill  . doxycycline (VIBRAMYCIN) 100 MG capsule Take 1 capsule (100 mg total) by mouth 2 (two) times daily. 20 capsule 0   No current facility-administered medications on file prior to visit.         Objective:   Physical Exam Blood pressure 104/70, pulse 72, temperature 98.2 F (36.8 C), height  (1.753 m), weight 122 lb (55.3 kg). Alert and oriented. Skin warm and dry. Oral mucosa is moist.   . Sclera anicteric, conjunctivae is pink. Thyroid not enlarged. No cervical lymphadenopathy. Lungs clear. Heart regular rate and rhythm.  Abdomen is soft. Bowel sounds are positive. No hepatomegaly. No abdominal masses felt. No tenderness.  No edema to lower extremities.           Assessment & Plan:  Rt flank pain. ? Etiology. ? If this is GI.  Will get a CBC, sedrate, urinalysis, hepatic function.

## 2018-03-10 LAB — URINALYSIS
Bilirubin Urine: NEGATIVE
Glucose, UA: NEGATIVE
Ketones, ur: NEGATIVE
Leukocytes, UA: NEGATIVE
Nitrite: NEGATIVE
Protein, ur: NEGATIVE
Specific Gravity, Urine: 1.018 (ref 1.001–1.03)
pH: 6.5 (ref 5.0–8.0)

## 2018-03-10 LAB — CBC WITH DIFFERENTIAL/PLATELET
BASOS ABS: 21 {cells}/uL (ref 0–200)
BASOS PCT: 0.5 %
Eosinophils Absolute: 202 cells/uL (ref 15–500)
Eosinophils Relative: 4.8 %
HEMATOCRIT: 42.7 % (ref 38.5–50.0)
Hemoglobin: 14.4 g/dL (ref 13.2–17.1)
LYMPHS ABS: 1982 {cells}/uL (ref 850–3900)
MCH: 29.2 pg (ref 27.0–33.0)
MCHC: 33.7 g/dL (ref 32.0–36.0)
MCV: 86.6 fL (ref 80.0–100.0)
MPV: 11.2 fL (ref 7.5–12.5)
Monocytes Relative: 7.2 %
NEUTROS ABS: 1693 {cells}/uL (ref 1500–7800)
Neutrophils Relative %: 40.3 %
Platelets: 147 10*3/uL (ref 140–400)
RBC: 4.93 10*6/uL (ref 4.20–5.80)
RDW: 13 % (ref 11.0–15.0)
Total Lymphocyte: 47.2 %
WBC: 4.2 10*3/uL (ref 3.8–10.8)
WBCMIX: 302 {cells}/uL (ref 200–950)

## 2018-03-10 LAB — HEPATIC FUNCTION PANEL
AG Ratio: 2.4 (calc) (ref 1.0–2.5)
ALT: 9 U/L (ref 8–46)
AST: 20 U/L (ref 12–32)
Albumin: 4.5 g/dL (ref 3.6–5.1)
Alkaline phosphatase (APISO): 80 U/L (ref 48–230)
Bilirubin, Direct: 0.2 mg/dL (ref 0.0–0.2)
Globulin: 1.9 g/dL (calc) — ABNORMAL LOW (ref 2.1–3.5)
Indirect Bilirubin: 0.3 mg/dL (calc) (ref 0.2–1.1)
Total Bilirubin: 0.5 mg/dL (ref 0.2–1.1)
Total Protein: 6.4 g/dL (ref 6.3–8.2)

## 2018-03-10 LAB — SEDIMENTATION RATE: SED RATE: 2 mm/h (ref 0–15)

## 2019-03-10 ENCOUNTER — Other Ambulatory Visit: Payer: Self-pay

## 2019-03-10 DIAGNOSIS — F840 Autistic disorder: Secondary | ICD-10-CM | POA: Diagnosis not present

## 2019-03-10 DIAGNOSIS — F17210 Nicotine dependence, cigarettes, uncomplicated: Secondary | ICD-10-CM | POA: Diagnosis not present

## 2019-03-10 DIAGNOSIS — R1011 Right upper quadrant pain: Secondary | ICD-10-CM | POA: Insufficient documentation

## 2019-03-10 DIAGNOSIS — Z79899 Other long term (current) drug therapy: Secondary | ICD-10-CM | POA: Diagnosis not present

## 2019-03-11 ENCOUNTER — Encounter (HOSPITAL_COMMUNITY): Payer: Self-pay | Admitting: Emergency Medicine

## 2019-03-11 ENCOUNTER — Other Ambulatory Visit: Payer: Self-pay

## 2019-03-11 ENCOUNTER — Emergency Department (HOSPITAL_COMMUNITY)
Admission: EM | Admit: 2019-03-11 | Discharge: 2019-03-11 | Disposition: A | Discharge status: Home / Self Care | Payer: Medicaid Other | Attending: Emergency Medicine | Admitting: Emergency Medicine

## 2019-03-11 DIAGNOSIS — R1011 Right upper quadrant pain: Secondary | ICD-10-CM

## 2019-03-11 LAB — COMPREHENSIVE METABOLIC PANEL
ALT: 14 U/L (ref 0–44)
AST: 31 U/L (ref 15–41)
Albumin: 4.8 g/dL (ref 3.5–5.0)
Alkaline Phosphatase: 88 U/L (ref 38–126)
Anion gap: 11 (ref 5–15)
BUN: 8 mg/dL (ref 6–20)
CO2: 26 mmol/L (ref 22–32)
Calcium: 9.5 mg/dL (ref 8.9–10.3)
Chloride: 105 mmol/L (ref 98–111)
Creatinine, Ser: 0.91 mg/dL (ref 0.61–1.24)
GFR calc Af Amer: >60 mL/min (ref >60)
GFR calc non Af Amer: >60 mL/min (ref >60)
Glucose, Bld: 101 mg/dL — ABNORMAL HIGH (ref 70–99)
Potassium: 3.5 mmol/L (ref 3.5–5.1)
Sodium: 142 mmol/L (ref 135–145)
Total Bilirubin: 0.5 mg/dL (ref 0.3–1.2)
Total Protein: 7.8 g/dL (ref 6.5–8.1)

## 2019-03-11 LAB — CBC WITH DIFFERENTIAL/PLATELET
Abs Immature Granulocytes: 0.01 10*3/uL (ref 0.00–0.07)
Basophils Absolute: 0 10*3/uL (ref 0.0–0.1)
Basophils Relative: 0 %
Eosinophils Absolute: 0.2 10*3/uL (ref 0.0–0.5)
Eosinophils Relative: 2 %
HCT: 43.9 % (ref 39.0–52.0)
Hemoglobin: 14.7 g/dL (ref 13.0–17.0)
Immature Granulocytes: 0 %
Lymphocytes Relative: 42 %
Lymphs Abs: 3.4 10*3/uL (ref 0.7–4.0)
MCH: 29.3 pg (ref 26.0–34.0)
MCHC: 33.5 g/dL (ref 30.0–36.0)
MCV: 87.6 fL (ref 80.0–100.0)
Monocytes Absolute: 0.5 10*3/uL (ref 0.1–1.0)
Monocytes Relative: 7 %
Neutro Abs: 3.9 10*3/uL (ref 1.7–7.7)
Neutrophils Relative %: 49 %
Platelets: 170 10*3/uL (ref 150–400)
RBC: 5.01 MIL/uL (ref 4.22–5.81)
RDW: 13.2 % (ref 11.5–15.5)
WBC: 8 10*3/uL (ref 4.0–10.5)
nRBC: 0 % (ref 0.0–0.2)

## 2019-03-11 LAB — URINALYSIS, ROUTINE W REFLEX MICROSCOPIC
Bilirubin Urine: NEGATIVE
Glucose, UA: NEGATIVE mg/dL
Hgb urine dipstick: NEGATIVE
Ketones, ur: NEGATIVE mg/dL
Leukocytes,Ua: NEGATIVE
Nitrite: NEGATIVE
Protein, ur: NEGATIVE mg/dL
Specific Gravity, Urine: 1.021 (ref 1.005–1.030)
pH: 5 (ref 5.0–8.0)

## 2019-03-11 LAB — LIPASE, BLOOD: Lipase: 68 U/L — ABNORMAL HIGH (ref 11–51)

## 2019-03-11 MED ORDER — ONDANSETRON 4 MG PO TBDP: 4.0000 mg | ORAL_TABLET | Freq: Three times a day (TID) | ORAL | 0 refills | Status: DC | PRN

## 2019-03-11 MED ORDER — ONDANSETRON HCL 4 MG/2ML IJ SOLN
4.0000 mg | Freq: Once | INTRAMUSCULAR | Status: AC
  Administered 2019-03-11: 4 mg via INTRAVENOUS
  Filled 2019-03-11: qty 2

## 2019-03-11 MED ORDER — MORPHINE SULFATE (PF) 4 MG/ML IV SOLN
4.0000 mg | Freq: Once | INTRAVENOUS | Status: AC
  Administered 2019-03-11: 4 mg via INTRAVENOUS
  Filled 2019-03-11: qty 1

## 2019-03-11 NOTE — ED Notes (Signed)
Patient states that he was able to drink a small amount of ginger-ale.

## 2019-03-11 NOTE — ED Triage Notes (Signed)
Pt states he wants his liver checked out. States he had an issue with it before and is c/o RLQ abdomen /Flank pain for 2-3 days.

## 2019-03-11 NOTE — Discharge Instructions (Addendum)
You were seen today for right upper quadrant pain.  The cause of your pain at this time is unknown.  However, your work-up is reassuring.  Return later today for an ultrasound.  Take Zofran as needed for nausea and vomiting.

## 2019-03-11 NOTE — ED Provider Notes (Signed)
[facility] EMERGENCY DEPARTMENT Provider Note   CSN: [id number] Arrival date & time: 03/10/19  2357    History   Chief Complaint Chief Complaint  Patient presents with  . Flank Pain    HPI Jeffrey Hunt is a 23 y.o. male.     HPI  This is a 23 year old male with a history of autism, bipolar disorder who presents with right upper quadrant and epigastric pain.  Patient reports 2 to 3-day history of worsening pain.  He reports that it is sharp and nonradiating.  It is worse with food intake.  He reports nausea and vomiting with eating.  No diarrhea or constipation.  Denies fevers.  Reports "I need to get my liver checked out."  Reports prior liver abnormalities which he cannot describe.  Currently he rates his pain 8 out of 10.  He has not taken anything for the pain.  He denies any illicit drug use or alcohol use.  Denies shortness of breath, chest pain, dysuria.  Past Medical History:  Diagnosis Date  . ADHD (attention deficit hyperactivity disorder)   . Autistic disorder   . Bipolar 1 disorder (HCC)   . Bipolar 2 disorder (HCC)   . Seizures (HCC)    when younger    Patient Active Problem List   Diagnosis Date Noted  . Allergic rhinitis 02/07/2013  . Mood disorder (HCC) 02/07/2013  . ADHD (attention deficit hyperactivity disorder) 02/07/2013    History reviewed. No pertinent surgical history.      Home Medications    Prior to Admission medications   Medication Sig Start Date End Date Taking? Authorizing Provider  doxycycline (VIBRAMYCIN) 100 MG capsule Take 1 capsule (100 mg total) by mouth 2 (two) times daily. 03/06/18   Devoria AlbeKnapp, Iva, MD  mirtazapine (REMERON) 15 MG tablet Take 15 mg by mouth at bedtime.    [provider]  ondansetron (ZOFRAN ODT) 4 MG disintegrating tablet Take 1 tablet (4 mg total) by mouth every 8 (eight) hours as needed for nausea or vomiting. 03/11/19   Horton, Mayer Maskerourtney F, MD  QUEtiapine (SEROQUEL XR) 300 MG 24 hr tablet Take 300 mg  by mouth at bedtime. Pt out of medication    [provider]    Family History No family history on file.  Social History Social History   Tobacco Use  . Smoking status: Current Every Day Smoker    Packs/day: 1.00    Types: Cigarettes  . Smokeless tobacco: Former NeurosurgeonUser    Types: Snuff  Substance Use Topics  . Alcohol use: Yes    Comment: occasionally  . Drug use: Yes    Comment: CBD weed     Allergies   Other and Stimulant laxative [bisacodyl]   Review of Systems Review of Systems  Constitutional: Negative for fever.  Respiratory: Negative for shortness of breath.   Cardiovascular: Negative for chest pain.  Gastrointestinal: Positive for abdominal pain, nausea and vomiting. Negative for constipation and diarrhea.  Genitourinary: Negative for dysuria.  Skin: Negative for rash.  Neurological: Negative for weakness.  All other systems reviewed and are negative.    Physical Exam Updated Vital Signs BP 109/70   Pulse (!) 40   Temp 98.4 F (36.9 C) (Oral)   Ht 1.753 m (5\' 9" )   Wt 70.3 kg   SpO2 99%   BMI 22.89 kg/m   Physical Exam Vitals signs and nursing note reviewed.  Constitutional:      General: He is not in acute distress.  Appearance: He is well-developed. He is not ill-appearing.     Comments: Disheveled appearing  HENT:     Head: Normocephalic and atraumatic.     Mouth/Throat:     Mouth: Mucous membranes are moist.  Eyes:     Pupils: Pupils are equal, round, and reactive to light.  Neck:     Musculoskeletal: Neck supple.  Cardiovascular:     Rate and Rhythm: Normal rate and regular rhythm.     Heart sounds: Normal heart sounds. No murmur.  Pulmonary:     Effort: Pulmonary effort is normal. No respiratory distress.     Breath sounds: Normal breath sounds. No wheezing.  Abdominal:     General: Bowel sounds are normal.     Palpations: Abdomen is soft.     Tenderness: There is abdominal tenderness. There is no guarding or rebound.      Comments: Right upper quadrant tenderness palpation, no rebound or guarding  Lymphadenopathy:     Cervical: No cervical adenopathy.  Skin:    General: Skin is warm and dry.  Neurological:     Mental Status: He is alert and oriented to person, place, and time.  Psychiatric:        Mood and Affect: Mood normal.      ED Treatments / Results  Labs (all labs ordered are listed, but only abnormal results are displayed) Labs Reviewed  COMPREHENSIVE METABOLIC PANEL - Abnormal; Notable for the following components:      Result Value   Glucose, Bld 101 (*)    All other components within normal limits  LIPASE, BLOOD - Abnormal; Notable for the following components:   Lipase 68 (*)    All other components within normal limits  CBC WITH DIFFERENTIAL/PLATELET  URINALYSIS, ROUTINE W REFLEX MICROSCOPIC    EKG None  Radiology No results found.  Procedures Procedures (including critical care time)  Medications Ordered in ED Medications  morphine 4 MG/ML injection 4 mg (4 mg Intravenous Given 03/11/19 0028)  ondansetron (ZOFRAN) injection 4 mg (4 mg Intravenous Given 03/11/19 0028)     Initial Impression / Assessment and Plan / ED Course  I have reviewed the triage vital signs and the nursing notes.  Pertinent labs & imaging results that were available during my care of the patient were reviewed by me and considered in my medical decision making (see chart for details).        Patient presents with right upper quadrant pain.  It is associated with food.  He is overall nontoxic-appearing and vital signs are reassuring.  He has tenderness on exam without signs of peritonitis.  Considerations include gastritis, cholecystitis, cholelithiasis, pancreatitis.  Less likely bowel obstruction.  I have reviewed his CT scan from 1 year ago.  He did have some liver hemangiomas at that time.  Follow-up with GI was largely unremarkable including negative hepatitis testing.  Patient was given  pain and nausea medication.  Lab work obtained.  Urinalysis without evidence of UTI.  Basic lab is largely unremarkable.  Lipase is 68 which is just above the upper limit of normal.  Denies any history of alcohol use.  Doubt pancreatitis.  On recheck, he is tolerating food without difficulty.  We will have him return for right upper quadrant ultrasound to evaluate his gallbladder and liver.  I discussed the findings with the patient.  He is reassured.  Given that his repeat abdominal exam is benign, low suspicion at this time for acute abdominal emergency.  After history,  exam, and medical workup I feel the patient has been appropriately medically screened and is safe for discharge home. Pertinent diagnoses were discussed with the patient. Patient was given return precautions.   Final Clinical Impressions(s) / ED Diagnoses   Final diagnoses:  RUQ pain    ED Discharge Orders         Ordered    ondansetron (ZOFRAN ODT) 4 MG disintegrating tablet  Every 8 hours PRN     03/11/19 0152    US Abdomen Limited RUQ/Gall Gladder     03/11/19 0152           Horton, Mayer Masker, MD 03/11/19 0157

## 2019-11-07 ENCOUNTER — Emergency Department (HOSPITAL_COMMUNITY): Payer: Medicaid Other

## 2019-11-07 ENCOUNTER — Other Ambulatory Visit: Payer: Self-pay

## 2019-11-07 ENCOUNTER — Encounter (HOSPITAL_COMMUNITY): Payer: Self-pay | Admitting: Emergency Medicine

## 2019-11-07 ENCOUNTER — Emergency Department (HOSPITAL_COMMUNITY)
Admission: EM | Admit: 2019-11-07 | Discharge: 2019-11-07 | Disposition: A | Discharge status: Home / Self Care | Payer: Medicaid Other | Attending: Emergency Medicine | Admitting: Emergency Medicine

## 2019-11-07 DIAGNOSIS — F840 Autistic disorder: Secondary | ICD-10-CM | POA: Insufficient documentation

## 2019-11-07 DIAGNOSIS — R0789 Other chest pain: Secondary | ICD-10-CM | POA: Diagnosis not present

## 2019-11-07 DIAGNOSIS — F17210 Nicotine dependence, cigarettes, uncomplicated: Secondary | ICD-10-CM | POA: Insufficient documentation

## 2019-11-07 DIAGNOSIS — Z79899 Other long term (current) drug therapy: Secondary | ICD-10-CM | POA: Insufficient documentation

## 2019-11-07 NOTE — ED Triage Notes (Signed)
PT c/o upper abdominal pain with no n/v/d over the past year and stated he had a visit on 02/2019 and they wanted to do an ultrasound to follow up from CT results but he was unable to follow up do to incarceration.

## 2019-11-07 NOTE — Discharge Instructions (Addendum)
There is no sign of significant injury to the left ribs or problems in the chest.  For the pain, use Motrin or Tylenol and use a heating pad on the sore area.  See your doctor as needed for problems.

## 2019-11-07 NOTE — ED Provider Notes (Signed)
[facility] EMERGENCY DEPARTMENT Provider Note   CSN: [id number] Arrival date & time: 11/07/19  1003     History Chief Complaint  Patient presents with  . Abdominal Pain    Jeffrey Hunt is a 24 y.o. male.  HPI Who presents for evaluation of left chest wall discomfort present for 2 weeks, which made him concerned about recurrence of an abdominal process.  He was evaluated here, May 2020, for right upper quadrant pain.  At that time he had CT imaging and was referred for ultrasound of the right upper quadrant, which he did not get.  He denies fever, chills, nausea, vomiting, abdominal pain, weakness or dizziness.  He thinks he might of had his chest injured when he was doing some MMA training, 2 weeks ago.  There are no other known modifying factors.    Past Medical History:  Diagnosis Date  . ADHD (attention deficit hyperactivity disorder)   . Autistic disorder   . Bipolar 1 disorder (HCC)   . Bipolar 2 disorder (HCC)   . Seizures (HCC)    when younger    Patient Active Problem List   Diagnosis Date Noted  . Allergic rhinitis 02/07/2013  . Mood disorder (HCC) 02/07/2013  . ADHD (attention deficit hyperactivity disorder) 02/07/2013    History reviewed. No pertinent surgical history.     History reviewed. No pertinent family history.  Social History   Tobacco Use  . Smoking status: Current Every Day Smoker    Packs/day: 1.00    Types: Cigarettes  . Smokeless tobacco: Former Neurosurgeon    Types: Snuff  Substance Use Topics  . Alcohol use: Yes    Comment: occasionally  . Drug use: Yes    Types: Marijuana    Comment: CBD weed    Home Medications Prior to Admission medications   Medication Sig Start Date End Date Taking? Authorizing Provider  mirtazapine (REMERON) 15 MG tablet Take 15 mg by mouth at bedtime.   Yes [provider]  QUEtiapine (SEROQUEL XR) 300 MG 24 hr tablet Take 300 mg by mouth at bedtime. Pt out of medication   Yes [provider]  doxycycline (VIBRAMYCIN) 100 MG capsule Take 1 capsule (100 mg total) by mouth 2 (two) times daily. Patient not taking: Reported on 11/07/2019 03/06/18   [name], MD  ondansetron (ZOFRAN ODT) 4 MG disintegrating tablet Take 1 tablet (4 mg total) by mouth every 8 (eight) hours as needed for nausea or vomiting. Patient not taking: Reported on 11/07/2019 03/11/19   Horton, Mayer Masker, MD    Allergies    Other and Stimulant laxative [bisacodyl]  Review of Systems   Review of Systems  All other systems reviewed and are negative.   Physical Exam Updated Vital Signs BP 122/77 (BP Location: Left Arm)   Pulse 76   Temp 98.2 F (36.8 C) (Oral)   Resp 18   Ht 5\' 9"  (1.753 m)   Wt 77.1 kg   SpO2 99%   BMI 25.10 kg/m   Physical Exam Vitals and nursing note reviewed.  Constitutional:      Appearance: He is well-developed.  HENT:     Head: Normocephalic and atraumatic.     Right Ear: External ear normal.     Left Ear: External ear normal.  Eyes:     Conjunctiva/sclera: Conjunctivae normal.     Pupils: Pupils are equal, round, and reactive to light.  Neck:     Trachea: Phonation normal.  Cardiovascular:  Rate and Rhythm: Normal rate and regular rhythm.     Heart sounds: Normal heart sounds.  Pulmonary:     Effort: Pulmonary effort is normal.     Breath sounds: Normal breath sounds.  Chest:     Chest wall: Tenderness (Left lateral chest wall, lower, diffuse, without crepitation or deformity.) present.  Abdominal:     General: There is no distension.     Palpations: Abdomen is soft.     Tenderness: There is no abdominal tenderness. There is no guarding.  Musculoskeletal:        General: Normal range of motion.     Cervical back: Normal range of motion and neck supple.  Skin:    General: Skin is warm and dry.  Neurological:     Mental Status: He is alert and oriented to person, place, and time.     Cranial Nerves: No cranial nerve deficit.     Sensory: No sensory  deficit.     Motor: No abnormal muscle tone.     Coordination: Coordination normal.  Psychiatric:        Mood and Affect: Mood normal.        Behavior: Behavior normal.        Thought Content: Thought content normal.        Judgment: Judgment normal.     ED Results / Procedures / Treatments   Labs (all labs ordered are listed, but only abnormal results are displayed) Labs Reviewed - No data to display  EKG None  Radiology No results found.  Procedures Procedures (including critical care time)  Medications Ordered in ED Medications - No data to display  ED Course  I have reviewed the triage vital signs and the nursing notes.  Pertinent labs & imaging results that were available during my care of the patient were reviewed by me and considered in my medical decision making (see chart for details).    MDM Rules/Calculators/A&P                       Patient Vitals for the past 24 hrs:  BP Temp Temp src Pulse Resp SpO2 Height Weight  11/07/19 1020 -- -- -- -- -- -- 5\' 9"  (1.753 m) 77.1 kg  11/07/19 1019 122/77 98.2 F (36.8 C) Oral 76 18 99 % -- --    At D/C Reevaluation with update and discussion. After initial assessment and treatment, an updated evaluation reveals he is comfortable and has no further complaints.  Findings discussed and questions answered. Daleen Bo   Medical Decision Making: Chest wall pain, left lower working out doing MMA, and being struck in the left chest wall.  Screening evaluation with imaging is reassuring for lack of fracture or pulmonary contusion.  Of biliary colic.  He has previously been worked up for a right upper quadrant process, May 2020 and not having residual symptoms requiring additional work-up for that at this time.  He is stable for discharge with symptomatic treatment  CRITICAL CARE- No Performed by: Daleen Bo  Nursing Notes Reviewed/ Care Coordinated Applicable Imaging Reviewed Interpretation of Laboratory Data  incorporated into ED treatment  The patient appears reasonably screened and/or stabilized for discharge and I doubt any other medical condition or other [facility] requiring further screening, evaluation, or treatment in the ED at this time prior to discharge.  Plan: Home Medications-OTC analgesia of choice; Home Treatments-to affected area; return here if the recommended treatment, does not improve the symptoms; Recommended follow  up-PCP, as needed   Final Clinical Impression(s) / ED Diagnoses Final diagnoses:  None    Rx / DC Orders ED Discharge Orders    None       Mancel Bale, MD 11/08/19 1425

## 2019-12-04 ENCOUNTER — Other Ambulatory Visit: Payer: Self-pay

## 2019-12-04 ENCOUNTER — Emergency Department (HOSPITAL_COMMUNITY)
Admission: EM | Admit: 2019-12-04 | Discharge: 2019-12-05 | Disposition: A | Discharge status: Home / Self Care | Payer: Medicaid Other | Attending: Emergency Medicine | Admitting: Emergency Medicine

## 2019-12-04 ENCOUNTER — Encounter (HOSPITAL_COMMUNITY): Payer: Self-pay | Admitting: Emergency Medicine

## 2019-12-04 DIAGNOSIS — Z79899 Other long term (current) drug therapy: Secondary | ICD-10-CM | POA: Diagnosis not present

## 2019-12-04 DIAGNOSIS — R42 Dizziness and giddiness: Secondary | ICD-10-CM | POA: Insufficient documentation

## 2019-12-04 DIAGNOSIS — F17210 Nicotine dependence, cigarettes, uncomplicated: Secondary | ICD-10-CM | POA: Diagnosis not present

## 2019-12-04 LAB — CBC WITH DIFFERENTIAL/PLATELET
Abs Immature Granulocytes: 0.03 10*3/uL (ref 0.00–0.07)
Basophils Absolute: 0 10*3/uL (ref 0.0–0.1)
Basophils Relative: 0 %
Eosinophils Absolute: 0.2 10*3/uL (ref 0.0–0.5)
Eosinophils Relative: 2 %
HCT: 49.1 % (ref 39.0–52.0)
Hemoglobin: 16 g/dL (ref 13.0–17.0)
Immature Granulocytes: 0 %
Lymphocytes Relative: 32 %
Lymphs Abs: 3 10*3/uL (ref 0.7–4.0)
MCH: 29.1 pg (ref 26.0–34.0)
MCHC: 32.6 g/dL (ref 30.0–36.0)
MCV: 89.4 fL (ref 80.0–100.0)
Monocytes Absolute: 0.6 10*3/uL (ref 0.1–1.0)
Monocytes Relative: 6 %
Neutro Abs: 5.6 10*3/uL (ref 1.7–7.7)
Neutrophils Relative %: 60 %
Platelets: 181 10*3/uL (ref 150–400)
RBC: 5.49 MIL/uL (ref 4.22–5.81)
RDW: 14.1 % (ref 11.5–15.5)
WBC: 9.5 10*3/uL (ref 4.0–10.5)
nRBC: 0 % (ref 0.0–0.2)

## 2019-12-04 LAB — BASIC METABOLIC PANEL
Anion gap: 10 (ref 5–15)
BUN: 12 mg/dL (ref 6–20)
CO2: 26 mmol/L (ref 22–32)
Calcium: 9.2 mg/dL (ref 8.9–10.3)
Chloride: 101 mmol/L (ref 98–111)
Creatinine, Ser: 0.86 mg/dL (ref 0.61–1.24)
GFR calc Af Amer: >60 mL/min (ref >60)
GFR calc non Af Amer: >60 mL/min (ref >60)
Glucose, Bld: 93 mg/dL (ref 70–99)
Potassium: 3.7 mmol/L (ref 3.5–5.1)
Sodium: 137 mmol/L (ref 135–145)

## 2019-12-04 NOTE — ED Triage Notes (Signed)
Patient complains of dizziness that began 30 minutes ago. The patient states he smoked weed this morning but it should not be making him feel dizzy now. Denies ingesting anything prior to symptoms.

## 2019-12-05 NOTE — ED Notes (Signed)
Pt ambulated to bathroom without difficulty.

## 2019-12-05 NOTE — ED Provider Notes (Signed)
[facility] EMERGENCY DEPARTMENT Provider Note   CSN: [id number] Arrival date & time: 12/04/19  2118     History Chief Complaint  Patient presents with  . Dizziness    Jeffrey Hunt is a 24 y.o. male.  HPI     This is a 24 year old male with a history of autism, bipolar disorder, seizures who presents with dizziness.  Patient reports onset of lightheaded feeling a few hours prior to arrival.  He describes it as feeling lightheaded but not room spinning.  It is somewhat worse with position changes.  He states that he smoked marijuana yesterday morning but "that should not have any effect on me now."  Denies any difficulty walking, weakness, speech difficulty.  Denies any chest pain or shortness of breath.  He does report that he has not had anything to eat today.  No known history of vertigo.  Past Medical History:  Diagnosis Date  . ADHD (attention deficit hyperactivity disorder)   . Autistic disorder   . Bipolar 1 disorder (HCC)   . Bipolar 2 disorder (HCC)   . Seizures (HCC)    when younger    Patient Active Problem List   Diagnosis Date Noted  . Allergic rhinitis 02/07/2013  . Mood disorder (HCC) 02/07/2013  . ADHD (attention deficit hyperactivity disorder) 02/07/2013    History reviewed. No pertinent surgical history.     No family history on file.  Social History   Tobacco Use  . Smoking status: Current Every Day Smoker    Packs/day: 2.00    Types: Cigarettes  . Smokeless tobacco: Former Neurosurgeon    Types: Snuff  Substance Use Topics  . Alcohol use: Yes    Comment: occasionally  . Drug use: Yes    Frequency: 7.0 times per week    Types: Marijuana    Comment: CBD weed    Home Medications Prior to Admission medications   Medication Sig Start Date End Date Taking? Authorizing Provider  doxycycline (VIBRAMYCIN) 100 MG capsule Take 1 capsule (100 mg total) by mouth 2 (two) times daily. Patient not taking: Reported on 11/07/2019 03/06/18   [name], MD    mirtazapine (REMERON) 15 MG tablet Take 15 mg by mouth at bedtime.    [provider]  ondansetron (ZOFRAN ODT) 4 MG disintegrating tablet Take 1 tablet (4 mg total) by mouth every 8 (eight) hours as needed for nausea or vomiting. Patient not taking: Reported on 11/07/2019 03/11/19   Dontavian Marchi, Mayer Masker, MD  QUEtiapine (SEROQUEL XR) 300 MG 24 hr tablet Take 300 mg by mouth at bedtime. Pt out of medication    [provider]  QUEtiapine (SEROQUEL) 100 MG tablet Take 150 mg by mouth daily. Take 150mg  tablet po each evening. 10/04/19   [provider]    Allergies    Other and Stimulant laxative [bisacodyl]  Review of Systems   Review of Systems  Constitutional: Negative for fever.  Respiratory: Negative for shortness of breath.   Cardiovascular: Negative for chest pain.  Gastrointestinal: Negative for abdominal pain, nausea and vomiting.  Neurological: Positive for light-headedness. Negative for speech difficulty, weakness and headaches.  All other systems reviewed and are negative.   Physical Exam Updated Vital Signs BP 121/76 (BP Location: Right Arm)   Pulse 86   Temp 98 F (36.7 C) (Oral)   Resp 17   Ht 1.753 m (5\' 9" )   Wt 77.1 kg   SpO2 100%   BMI 25.10 kg/m   Physical  Exam Vitals and nursing note reviewed.  Constitutional:      Appearance: He is well-developed. He is not ill-appearing.  HENT:     Head: Normocephalic and atraumatic.     Right Ear: Tympanic membrane normal.     Left Ear: Tympanic membrane normal.     Mouth/Throat:     Mouth: Mucous membranes are moist.  Eyes:     Extraocular Movements: Extraocular movements intact.     Pupils: Pupils are equal, round, and reactive to light.  Cardiovascular:     Rate and Rhythm: Normal rate and regular rhythm.     Heart sounds: Normal heart sounds. No murmur.  Pulmonary:     Effort: Pulmonary effort is normal. No respiratory distress.     Breath sounds: Normal breath sounds. No wheezing.   Abdominal:     Palpations: Abdomen is soft.     Tenderness: There is no abdominal tenderness.  Musculoskeletal:     Cervical back: Neck supple.     Right lower leg: No edema.     Left lower leg: No edema.  Skin:    General: Skin is warm and dry.  Neurological:     Mental Status: He is alert and oriented to person, place, and time.     Comments: Fluent speech, cranial nerves II through XII intact, 5 out of 5 strength in all 4 extremities, no dysmetria to finger-nose-finger  Psychiatric:     Comments: Flat affect, avoids eye contact     ED Results / Procedures / Treatments   Labs (all labs ordered are listed, but only abnormal results are displayed) Labs Reviewed  CBC WITH DIFFERENTIAL/PLATELET  BASIC METABOLIC PANEL  URINALYSIS, ROUTINE W REFLEX MICROSCOPIC    EKG EKG Interpretation  Date/Time:  Tuesday December 04 2019 21:49:32 EST Ventricular Rate:  82 PR Interval:  162 QRS Duration: 94 QT Interval:  336 QTC Calculation: 392 R Axis:   -54 Text Interpretation: Normal sinus rhythm Left axis deviation Abnormal ECG new t wave inversions inferior compared with prior 1/19 Confirmed by Aletta Edouard [phone]) on 12/04/2019 9:58:00 PM   Radiology No results found.  Procedures Procedures (including critical care time)  Medications Ordered in ED Medications - No data to display  ED Course  I have reviewed the triage vital signs and the nursing notes.  Pertinent labs & imaging results that were available during my care of the patient were reviewed by me and considered in my medical decision making (see chart for details).    MDM Rules/Calculators/A&P                      Patient presents with lightheadedness.  He is overall nontoxic and vital signs are reassuring.  Neurologic exam is normal.  EKG is without ischemic changes and arrhythmia.  Basic lab work-up is reassuring without significant metabolic complaint.  He cannot orthostatic.  Suspect his lightheadedness may  be related to not eating today.  He was provided with food and was able to ambulate without significant dizziness.  After history, exam, and medical workup I feel the patient has been appropriately medically screened and is safe for discharge home. Pertinent diagnoses were discussed with the patient. Patient was given return precautions.   Final Clinical Impression(s) / ED Diagnoses Final diagnoses:  Lightheaded    Rx / DC Orders ED Discharge Orders    None       Sherron Mapp, Barbette Hair, MD 12/05/19 [phone]

## 2019-12-05 NOTE — Discharge Instructions (Addendum)
You were seen today for dizziness.  Your work-up is reassuring.  This may be related to not eating.  Make sure that you are hydrating well and eating frequent small meals.

## 2019-12-17 ENCOUNTER — Other Ambulatory Visit: Payer: Self-pay

## 2019-12-17 ENCOUNTER — Ambulatory Visit: Payer: Medicaid Other | Attending: Internal Medicine

## 2019-12-17 DIAGNOSIS — Z20822 Contact with and (suspected) exposure to covid-19: Secondary | ICD-10-CM

## 2019-12-18 LAB — NOVEL CORONAVIRUS, NAA: SARS-CoV-2, NAA: NOT DETECTED

## 2019-12-19 ENCOUNTER — Telehealth: Payer: Self-pay | Admitting: *Deleted

## 2019-12-19 NOTE — Telephone Encounter (Signed)
Patient calling for test result: COVID negative. Discussed ways to get result for shelter- MyChart-, [employer], Catering manager. Sent activation code to email.

## 2019-12-26 ENCOUNTER — Encounter (HOSPITAL_COMMUNITY): Payer: Self-pay | Admitting: *Deleted

## 2019-12-26 ENCOUNTER — Emergency Department (HOSPITAL_COMMUNITY)
Admission: EM | Admit: 2019-12-26 | Discharge: 2019-12-26 | Disposition: A | Discharge status: Home / Self Care | Payer: Medicaid Other | Attending: Emergency Medicine | Admitting: Emergency Medicine

## 2019-12-26 ENCOUNTER — Other Ambulatory Visit: Payer: Self-pay

## 2019-12-26 DIAGNOSIS — Z711 Person with feared health complaint in whom no diagnosis is made: Secondary | ICD-10-CM

## 2019-12-26 DIAGNOSIS — F909 Attention-deficit hyperactivity disorder, unspecified type: Secondary | ICD-10-CM | POA: Diagnosis not present

## 2019-12-26 DIAGNOSIS — F17210 Nicotine dependence, cigarettes, uncomplicated: Secondary | ICD-10-CM | POA: Insufficient documentation

## 2019-12-26 DIAGNOSIS — F1210 Cannabis abuse, uncomplicated: Secondary | ICD-10-CM | POA: Insufficient documentation

## 2019-12-26 DIAGNOSIS — Z79899 Other long term (current) drug therapy: Secondary | ICD-10-CM | POA: Diagnosis not present

## 2019-12-26 DIAGNOSIS — Z202 Contact with and (suspected) exposure to infections with a predominantly sexual mode of transmission: Secondary | ICD-10-CM | POA: Diagnosis present

## 2019-12-26 LAB — URINALYSIS, ROUTINE W REFLEX MICROSCOPIC
Bilirubin Urine: NEGATIVE
Glucose, UA: NEGATIVE mg/dL
Hgb urine dipstick: NEGATIVE
Ketones, ur: 20 mg/dL — AB
Leukocytes,Ua: NEGATIVE
Nitrite: NEGATIVE
Protein, ur: NEGATIVE mg/dL
Specific Gravity, Urine: 1.023 (ref 1.005–1.030)
pH: 5 (ref 5.0–8.0)

## 2019-12-26 NOTE — ED Provider Notes (Signed)
[facility] EMERGENCY DEPARTMENT Provider Note   CSN: [id number] Arrival date & time: 12/26/19  1134     History Chief Complaint  Patient presents with  . SEXUALLY TRANSMITTED DISEASE    Jeffrey Hunt is a 24 y.o. male past history of ADHD, bipolar who presents for evaluation of wanting test for STD.  Patient reports that he was sexually active with a new partner a few days ago.  They did not use protection.  He states he is not currently having any symptoms but he wanted to get checked.  No prior history of STDs.  Denies any penile discharge, dysuria, testicular pain or swelling, penile pain or swelling.  The history is provided by the patient.       Past Medical History:  Diagnosis Date  . ADHD (attention deficit hyperactivity disorder)   . Autistic disorder   . Bipolar 1 disorder ([place])   . Bipolar 2 disorder ([place])   . Seizures ([place])    when younger    Patient Active Problem List   Diagnosis Date Noted  . Allergic rhinitis 02/07/2013  . Mood disorder ([place]) 02/07/2013  . ADHD (attention deficit hyperactivity disorder) 02/07/2013    History reviewed. No pertinent surgical history.     History reviewed. No pertinent family history.  Social History   Tobacco Use  . Smoking status: Current Every Day Smoker    Packs/day: 2.00    Types: Cigarettes  . Smokeless tobacco: Former Systems developer    Types: Snuff  Substance Use Topics  . Alcohol use: Yes    Comment: occasionally  . Drug use: Yes    Frequency: 7.0 times per week    Types: Marijuana    Comment: CBD weed    Home Medications Prior to Admission medications   Medication Sig Start Date End Date Taking? Authorizing Provider  doxycycline (VIBRAMYCIN) 100 MG capsule Take 1 capsule (100 mg total) by mouth 2 (two) times daily. Patient not taking: Reported on 11/07/2019 03/06/18   Rolland Porter, MD  mirtazapine (REMERON) 15 MG tablet Take 15 mg by mouth at bedtime.    [provider]  ondansetron (ZOFRAN ODT) 4  MG disintegrating tablet Take 1 tablet (4 mg total) by mouth every 8 (eight) hours as needed for nausea or vomiting. Patient not taking: Reported on 11/07/2019 03/11/19   Horton, Barbette Hair, MD  QUEtiapine (SEROQUEL XR) 300 MG 24 hr tablet Take 300 mg by mouth at bedtime. Pt out of medication    [provider]  QUEtiapine (SEROQUEL) 100 MG tablet Take 150 mg by mouth daily. Take 150mg  tablet po each evening. 10/04/19   [provider]    Allergies    Other and Stimulant laxative [bisacodyl]  Review of Systems   Review of Systems  Genitourinary: Negative for discharge, dysuria, penile pain, penile swelling and scrotal swelling.  All other systems reviewed and are negative.   Physical Exam Updated Vital Signs BP 127/77 (BP Location: Right Arm)   Pulse (!) 50   Temp 97.6 F (36.4 C) (Oral)   Resp 18   Ht 5\' 9"  (1.753 m)   Wt 70.3 kg   SpO2 99%   BMI 22.89 kg/m   Physical Exam Vitals and nursing note reviewed. Exam conducted with a chaperone present.  Constitutional:      Appearance: He is well-developed.  HENT:     Head: Normocephalic and atraumatic.  Eyes:     General: No scleral icterus.  Right eye: No discharge.        Left eye: No discharge.     Conjunctiva/sclera: Conjunctivae normal.  Pulmonary:     Effort: Pulmonary effort is normal.  Genitourinary:    Penis: Circumcised. No discharge.      Testes:        Right: Tenderness not present.        Left: Tenderness or swelling not present.     Comments: The exam was performed with a chaperone present. Normal male genitalia. No evidence of rash, ulcers or lesions.  No penile discharge present.  Bilateral testicles are without any overlying warmth, erythema, edema. Skin:    General: Skin is warm and dry.  Neurological:     Mental Status: He is alert.  Psychiatric:        Speech: Speech normal.        Behavior: Behavior normal.     ED Results / Procedures / Treatments   Labs (all labs  ordered are listed, but only abnormal results are displayed) Labs Reviewed  URINALYSIS, ROUTINE W REFLEX MICROSCOPIC - Abnormal; Notable for the following components:      Result Value   Ketones, ur 20 (*)    All other components within normal limits  GC/CHLAMYDIA PROBE AMP ([place]) NOT AT [facility]    EKG None  Radiology No results found.  Procedures Procedures (including critical care time)  Medications Ordered in ED Medications - No data to display  ED Course  I have reviewed the triage vital signs and the nursing notes.  Pertinent labs & imaging results that were available during my care of the patient were reviewed by me and considered in my medical decision making (see chart for details).    MDM Rules/Calculators/A&P                      24 y.o. M who presents for evaluation of wanting to be tested for STD after new partner. Patient is afebrile, non-toxic appearing, sitting comfortably on examination table. Vital signs reviewed and stable. GU exam shows no acute abnormalities.  No tenderness to palpation of the testes or epididymis to suggest orchitis or epididymitis.  STD cultures obtained and pending. Discussed with patient treatment options including treatment today or waiting until cultures returned for treatment.  Patient wishes to decline treatment till results come back.  Discussed importance of using protection when sexually active. Pt understands that they have GC/Chlamydia cultures pending and that they will need to inform all sexual partners if results return positive. Patient had ample opportunity for questions and discussion. All patient's questions were answered with full understanding. Strict return precautions discussed. Patient expresses understanding and agreement to plan.   Portions of this note were generated with Scientist, clinical (histocompatibility and immunogenetics). Dictation errors may occur despite best attempts at proofreading.   Final Clinical Impression(s) / ED  Diagnoses Final diagnoses:  Concern about STD in male without diagnosis    Rx / DC Orders ED Discharge Orders    None       Rosana Hoes 12/26/19 2032    Donnetta Hutching, MD 12/27/19 [phone]

## 2019-12-26 NOTE — ED Triage Notes (Signed)
Pt states he wants to be tested for any STDs; pt states he has been with a girl and "just wants to know"

## 2019-12-26 NOTE — Discharge Instructions (Signed)
You have been tested today for an STD.   The test results with take 2-3 days to return. If there is an abnormal result, you will be notified. If you do not hear anything, that means the results were negative. You can also log on MyChart to see the results.   If you are positive, you will need to seek treatment.  They will discuss how you can get treated.Your sexual partner needs to be treated too. Do not have sexual intercourse for the next 7 days and after your partner has been treated.   Follow-up with your primary care doctor in 2-4 days. If you do not have a primary care doctor, you can use one listed in the paperwork.   Return to the Emergency Department for any fever, abdominal pain, difficulty breathing, nausea/vomiting or any other worsening or concerning symptoms.

## 2019-12-27 LAB — GC/CHLAMYDIA PROBE AMP (~~LOC~~) NOT AT ARMC
Chlamydia: NEGATIVE
Neisseria Gonorrhea: NEGATIVE

## 2020-02-21 ENCOUNTER — Other Ambulatory Visit: Payer: Self-pay

## 2020-02-21 ENCOUNTER — Emergency Department (HOSPITAL_COMMUNITY)
Admission: EM | Admit: 2020-02-21 | Discharge: 2020-02-21 | Disposition: A | Discharge status: Home / Self Care | Payer: Medicaid Other | Attending: Emergency Medicine | Admitting: Emergency Medicine

## 2020-02-21 ENCOUNTER — Emergency Department (HOSPITAL_COMMUNITY): Payer: Medicaid Other

## 2020-02-21 ENCOUNTER — Encounter (HOSPITAL_COMMUNITY): Payer: Self-pay | Admitting: Emergency Medicine

## 2020-02-21 DIAGNOSIS — R300 Dysuria: Secondary | ICD-10-CM | POA: Insufficient documentation

## 2020-02-21 DIAGNOSIS — Z79899 Other long term (current) drug therapy: Secondary | ICD-10-CM | POA: Diagnosis not present

## 2020-02-21 DIAGNOSIS — N200 Calculus of kidney: Secondary | ICD-10-CM | POA: Insufficient documentation

## 2020-02-21 DIAGNOSIS — F840 Autistic disorder: Secondary | ICD-10-CM | POA: Diagnosis not present

## 2020-02-21 DIAGNOSIS — F17210 Nicotine dependence, cigarettes, uncomplicated: Secondary | ICD-10-CM | POA: Insufficient documentation

## 2020-02-21 DIAGNOSIS — R109 Unspecified abdominal pain: Secondary | ICD-10-CM

## 2020-02-21 DIAGNOSIS — F909 Attention-deficit hyperactivity disorder, unspecified type: Secondary | ICD-10-CM | POA: Insufficient documentation

## 2020-02-21 LAB — COMPREHENSIVE METABOLIC PANEL
ALT: 15 U/L (ref 0–44)
AST: 31 U/L (ref 15–41)
Albumin: 5.1 g/dL — ABNORMAL HIGH (ref 3.5–5.0)
Alkaline Phosphatase: 85 U/L (ref 38–126)
Anion gap: 11 (ref 5–15)
BUN: 12 mg/dL (ref 6–20)
CO2: 27 mmol/L (ref 22–32)
Calcium: 9.7 mg/dL (ref 8.9–10.3)
Chloride: 102 mmol/L (ref 98–111)
Creatinine, Ser: 0.93 mg/dL (ref 0.61–1.24)
GFR calc Af Amer: >60 mL/min (ref >60)
GFR calc non Af Amer: >60 mL/min (ref >60)
Glucose, Bld: 96 mg/dL (ref 70–99)
Potassium: 4.3 mmol/L (ref 3.5–5.1)
Sodium: 140 mmol/L (ref 135–145)
Total Bilirubin: 0.9 mg/dL (ref 0.3–1.2)
Total Protein: 7.9 g/dL (ref 6.5–8.1)

## 2020-02-21 LAB — URINALYSIS, ROUTINE W REFLEX MICROSCOPIC
Bacteria, UA: NONE SEEN
Bilirubin Urine: NEGATIVE
Glucose, UA: NEGATIVE mg/dL
Hgb urine dipstick: NEGATIVE
Ketones, ur: 20 mg/dL — AB
Leukocytes,Ua: NEGATIVE
Nitrite: NEGATIVE
Protein, ur: 30 mg/dL — AB
Specific Gravity, Urine: 1.029 (ref 1.005–1.030)
pH: 5 (ref 5.0–8.0)

## 2020-02-21 LAB — CBC WITH DIFFERENTIAL/PLATELET
Abs Immature Granulocytes: 0.01 10*3/uL (ref 0.00–0.07)
Basophils Absolute: 0 10*3/uL (ref 0.0–0.1)
Basophils Relative: 0 %
Eosinophils Absolute: 0.1 10*3/uL (ref 0.0–0.5)
Eosinophils Relative: 2 %
HCT: 49.2 % (ref 39.0–52.0)
Hemoglobin: 16 g/dL (ref 13.0–17.0)
Immature Granulocytes: 0 %
Lymphocytes Relative: 30 %
Lymphs Abs: 2.5 10*3/uL (ref 0.7–4.0)
MCH: 28.8 pg (ref 26.0–34.0)
MCHC: 32.5 g/dL (ref 30.0–36.0)
MCV: 88.5 fL (ref 80.0–100.0)
Monocytes Absolute: 0.6 10*3/uL (ref 0.1–1.0)
Monocytes Relative: 8 %
Neutro Abs: 4.9 10*3/uL (ref 1.7–7.7)
Neutrophils Relative %: 60 %
Platelets: 162 10*3/uL (ref 150–400)
RBC: 5.56 MIL/uL (ref 4.22–5.81)
RDW: 13.8 % (ref 11.5–15.5)
WBC: 8.2 10*3/uL (ref 4.0–10.5)
nRBC: 0 % (ref 0.0–0.2)

## 2020-02-21 LAB — TSH: TSH: 2.58 u[IU]/mL (ref 0.350–4.500)

## 2020-02-21 NOTE — Discharge Instructions (Addendum)
As discussed, call [facility] for further evaluation if you continue to have your flank pain and urinary symptoms.  As discussed, several cultures are currently pending including a urine culture.  A gonorrhea and chlamydia culture is also pending as discussed. These infections can cause urinary symptoms so have been ordered from your urine sample.  You will be notified if any of these findings are positive and antibiotics will be prescribed if needed.   Also, see the referral list for community resources and counseling services.

## 2020-02-21 NOTE — ED Triage Notes (Signed)
Pt presents with right sided flank pain. Pt reporting that he is not "getting all my pee out." Pt also reports "not liking life right now and I need to talk to someone." Denies SI/HI ideation.

## 2020-02-21 NOTE — ED Provider Notes (Signed)
[facility] EMERGENCY DEPARTMENT Provider Note   CSN: [id number] Arrival date & time: 02/21/20  0153     History Chief Complaint  Patient presents with  . Flank Pain    Jeffrey Hunt is a 24 y.o. male with a history of ADHD and bipolar disorder presenting with right flank pain in association with sensation of incomplete emptying of his bladder, reporting frequent episodes of small amounts of urine production. He has had no gross hematuria but his urine has been darker than normal.  He denies n/v, no loss of appetite.   He denies penile discharge or risks for std's, no fevers, chills or abdominal/pelvic pain. He reports he has some chronic right flank to upper back and right shoulder pain secondary to prior history of trauma including right elbow and shoulder fractures, but todays flank is escalated and has been worsened for several days.  He has had no treatment prior to arrival for this condition.     Additionally, reports problems with depression and decreased energy levels. He currently resides with a friend but endorses friction with this arrangement.   He endorses a 20 lb weight loss in recent months but also states he is eating less than normal as he and roommate have conflicts over food sharing.   He denies SI/HI but endorses he would like to establish care with  someone for mental health concerns. He does not have any supportive family currently.   He used to be a seen at [facility] but has aged out of this service.    The history is provided by the patient.       Past Medical History:  Diagnosis Date  . ADHD (attention deficit hyperactivity disorder)   . Autistic disorder   . Bipolar 1 disorder (HCC)   . Bipolar 2 disorder (HCC)   . Seizures (HCC)    when younger    Patient Active Problem List   Diagnosis Date Noted  . Allergic rhinitis 02/07/2013  . Mood disorder (HCC) 02/07/2013  . ADHD (attention deficit hyperactivity disorder) 02/07/2013    History reviewed. No  pertinent surgical history.     No family history on file.  Social History   Tobacco Use  . Smoking status: Current Every Day Smoker    Packs/day: 2.00    Types: Cigarettes  . Smokeless tobacco: Former Neurosurgeon    Types: Snuff  Substance Use Topics  . Alcohol use: Yes    Comment: occasionally  . Drug use: Yes    Frequency: 7.0 times per week    Types: Marijuana    Comment: CBD weed    Home Medications Prior to Admission medications   Medication Sig Start Date End Date Taking? Authorizing Provider  doxycycline (VIBRAMYCIN) 100 MG capsule Take 1 capsule (100 mg total) by mouth 2 (two) times daily. Patient not taking: Reported on 11/07/2019 03/06/18   [name], MD  mirtazapine (REMERON) 15 MG tablet Take 15 mg by mouth at bedtime.    [provider]  ondansetron (ZOFRAN ODT) 4 MG disintegrating tablet Take 1 tablet (4 mg total) by mouth every 8 (eight) hours as needed for nausea or vomiting. Patient not taking: Reported on 11/07/2019 03/11/19   Horton, Mayer Masker, MD  QUEtiapine (SEROQUEL XR) 300 MG 24 hr tablet Take 300 mg by mouth at bedtime. Pt out of medication    [provider]  QUEtiapine (SEROQUEL) 100 MG tablet Take 150 mg by mouth daily. Take 150mg  tablet po each evening. 10/04/19  [provider]    Allergies    Other and Stimulant laxative [bisacodyl]  Review of Systems   Review of Systems  Constitutional: Negative for chills and fever.  HENT: Negative for congestion.   Eyes: Negative.   Respiratory: Negative for chest tightness and shortness of breath.   Cardiovascular: Negative for chest pain.  Gastrointestinal: Negative for abdominal pain, nausea and vomiting.  Genitourinary: Positive for dysuria, flank pain and frequency.  Musculoskeletal: Negative for arthralgias, joint swelling and neck pain.  Skin: Negative.  Negative for rash and wound.  Neurological: Negative for dizziness, weakness, light-headedness, numbness and headaches.    Psychiatric/Behavioral: Positive for dysphoric mood. Negative for agitation, self-injury and suicidal ideas. The patient is not nervous/anxious.     Physical Exam Updated Vital Signs BP (!) 104/51 (BP Location: Right Arm)   Pulse (!) 51   Temp 97.8 F (36.6 C) (Oral)   Resp 15   Ht 5\' 8"  (1.727 m)   Wt 63 kg   SpO2 99%   BMI 21.13 kg/m   Physical Exam Vitals and nursing note reviewed.  Constitutional:      Appearance: He is well-developed.  HENT:     Head: Normocephalic and atraumatic.  Eyes:     Conjunctiva/sclera: Conjunctivae normal.  Cardiovascular:     Rate and Rhythm: Normal rate and regular rhythm.     Heart sounds: Normal heart sounds.  Pulmonary:     Effort: Pulmonary effort is normal.     Breath sounds: Normal breath sounds. No wheezing.  Abdominal:     General: Bowel sounds are normal.     Palpations: Abdomen is soft.     Tenderness: There is no abdominal tenderness. There is right CVA tenderness.  Musculoskeletal:        General: Normal range of motion.     Cervical back: Normal range of motion.  Skin:    General: Skin is warm and dry.  Neurological:     Mental Status: He is alert.  Psychiatric:        Attention and Perception: Attention normal.        Mood and Affect: Mood normal.        Speech: Speech normal.        Behavior: Behavior normal. Behavior is not agitated or withdrawn. Behavior is cooperative.        Thought Content: Thought content normal. Thought content is not paranoid or delusional. Thought content does not include homicidal or suicidal ideation.        Cognition and Memory: Cognition normal.     ED Results / Procedures / Treatments   Labs (all labs ordered are listed, but only abnormal results are displayed) Labs Reviewed  URINALYSIS, ROUTINE W REFLEX MICROSCOPIC - Abnormal; Notable for the following components:      Result Value   Ketones, ur 20 (*)    Protein, ur 30 (*)    All other components within normal limits   COMPREHENSIVE METABOLIC PANEL - Abnormal; Notable for the following components:   Albumin 5.1 (*)    All other components within normal limits  URINE CULTURE  CBC WITH DIFFERENTIAL/PLATELET  TSH  GC/CHLAMYDIA PROBE AMP ([place]) NOT AT [facility]    EKG None  Radiology CT Renal Stone Study  Result Date: 02/21/2020 CLINICAL DATA:  24 year old male with history of flank pain and difficulty urinating. EXAM: CT ABDOMEN AND PELVIS WITHOUT CONTRAST TECHNIQUE: Multidetector CT imaging of the abdomen and pelvis was performed following the standard protocol  without IV contrast. COMPARISON:  CT the abdomen and pelvis 03/06/2018. FINDINGS: Lower chest: Unremarkable. Hepatobiliary: Ill-defined low-attenuation in the left lobe of the liver adjacent to the falciform ligament, most compatible with focal fatty infiltration. No definite suspicious cystic or solid hepatic lesions are confidently identified on today's noncontrast CT examination. Unenhanced appearance of the gallbladder is normal. Pancreas: No definite pancreatic mass or peripancreatic fluid collections or inflammatory changes are noted on today's noncontrast CT examination. Spleen: Unremarkable. Adrenals/Urinary Tract: 2 mm nonobstructive calculus in the lower pole collecting system of the left kidney. No additional calculi are noted within the right renal collecting system, along the course of either ureter, or within the lumen of the urinary bladder. No hydroureteronephrosis. Unenhanced appearance of the kidneys, bilateral adrenal glands and urinary bladder is otherwise unremarkable. Stomach/Bowel: Unenhanced appearance of the stomach is normal. No pathologic dilatation of small bowel or colon. Normal appendix. Vascular/Lymphatic: No atherosclerotic calcifications in the abdominal aorta or pelvic vasculature. No lymphadenopathy noted in the abdomen or pelvis. Reproductive: Prostate gland and seminal vesicles are unremarkable in appearance. Other: No  significant volume of ascites.  No pneumoperitoneum. Musculoskeletal: There are no aggressive appearing lytic or blastic lesions noted in the visualized portions of the skeleton. IMPRESSION: 1. 2 mm nonobstructive calculus in the lower pole collecting system of left kidney. No ureteral stones or findings of urinary tract obstruction are noted at this time. Electronically Signed   By: Vinnie Langton M.D.   On: 02/21/2020 11:09    Procedures Procedures (including critical care time)  Medications Ordered in ED Medications - No data to display  ED Course  I have reviewed the triage vital signs and the nursing notes.  Pertinent labs & imaging results that were available during my care of the patient were reviewed by me and considered in my medical decision making (see chart for details).    MDM Rules/Calculators/A&P                      Pt's labs and imaging reviewed and discussed with pt. He has no uti, he does chronically have proteinuria of unclear etiology, renal function appears normal today. Cultures pending including urine, gc/chlamydia.  Pt denies penile dc or risk for std's. Left renal stone, no ureteral stones present per CT imaging. He appeared comfortable during this ed stay, slept while awaiting results. No acute pain.  He was given referral to urology for further eval/management.  Also given referrals for outpatient counseling services. He has problems with depression and frustration with his current life situation, roommate frustrations and lack of support from his family.  He denies SI/HI. Has aged out of [facility].  He was given referrals for outpatient tx including [place] in [facility] may also offer good service for this pt.  Return precautions outlined.    The patient appears reasonably screened and/or stabilized for discharge and I doubt any other medical condition or other [facility] requiring further screening, evaluation, or treatment in the ED at this time prior to  discharge.  Final Clinical Impression(s) / ED Diagnoses Final diagnoses:  Dysuria  Right flank pain  Kidney stone    Rx / DC Orders ED Discharge Orders    None       Landis Martins 02/21/20 1311    Dorie Rank, MD 02/22/20 [phone]

## 2020-02-22 LAB — GC/CHLAMYDIA PROBE AMP (~~LOC~~) NOT AT ARMC
Chlamydia: NEGATIVE
Comment: NEGATIVE
Comment: NORMAL
Neisseria Gonorrhea: NEGATIVE

## 2020-02-23 LAB — URINE CULTURE: Culture: 60000 — AB

## 2020-02-24 ENCOUNTER — Telehealth: Payer: Self-pay | Admitting: Emergency Medicine

## 2020-02-24 NOTE — Telephone Encounter (Signed)
Post ED Visit - Positive Culture Follow-up  Culture report reviewed by antimicrobial stewardship pharmacist: [facility] []  , Pharm.D. []  Enzo Bi, .D., BCPS AQ-ID []  Celedonio Miyamoto, Pharm.D., BCPS []  [address], Pharm.D., BCPS []  [place], Garvin Fila.D., BCPS, AAHIVP []  , Pharm.D., BCPS, AAHIVP []  Georgina Pillion, PharmD, BCPS []  , PharmD, BCPS []  [place], PharmD, BCPS []  [address], PharmD []  , PharmD, BCPS [x]  Estella Husk, PharmD  Pharmacy Team []  Lysle Pearl, PharmD []  , PharmD []  Phillips Climes, PharmD []  , Rph []  Agapito Games) , PharmD []  Verlan Friends, PharmD []  , PharmD []  Mervyn Gay, PharmD []  , PharmD []  Vinnie Level, PharmD []  [facility], PharmD []  , PharmD []  Len Childs, PharmD   Positive urine culture No further patient follow-up is required at this time.  [place] 02/24/2020, 5:12 PM

## 2020-04-25 ENCOUNTER — Encounter (HOSPITAL_COMMUNITY): Payer: Self-pay | Admitting: Emergency Medicine

## 2020-04-25 ENCOUNTER — Emergency Department (HOSPITAL_COMMUNITY): Payer: Medicaid Other

## 2020-04-25 ENCOUNTER — Emergency Department (HOSPITAL_COMMUNITY)
Admission: EM | Admit: 2020-04-25 | Discharge: 2020-04-25 | Disposition: A | Discharge status: Home / Self Care | Payer: Medicaid Other | Attending: Emergency Medicine | Admitting: Emergency Medicine

## 2020-04-25 ENCOUNTER — Other Ambulatory Visit: Payer: Self-pay

## 2020-04-25 DIAGNOSIS — F17210 Nicotine dependence, cigarettes, uncomplicated: Secondary | ICD-10-CM | POA: Insufficient documentation

## 2020-04-25 DIAGNOSIS — Z20822 Contact with and (suspected) exposure to covid-19: Secondary | ICD-10-CM | POA: Insufficient documentation

## 2020-04-25 DIAGNOSIS — R05 Cough: Secondary | ICD-10-CM | POA: Diagnosis present

## 2020-04-25 DIAGNOSIS — J40 Bronchitis, not specified as acute or chronic: Secondary | ICD-10-CM

## 2020-04-25 DIAGNOSIS — Z720 Tobacco use: Secondary | ICD-10-CM

## 2020-04-25 LAB — SARS CORONAVIRUS 2 BY RT PCR (HOSPITAL ORDER, PERFORMED IN ~~LOC~~ HOSPITAL LAB): SARS Coronavirus 2: NEGATIVE

## 2020-04-25 MED ORDER — IPRATROPIUM-ALBUTEROL 0.5-2.5 (3) MG/3ML IN SOLN
3.0000 mL | Freq: Once | RESPIRATORY_TRACT | Status: AC
  Administered 2020-04-25: 3 mL via RESPIRATORY_TRACT
  Filled 2020-04-25: qty 3

## 2020-04-25 MED ORDER — PREDNISONE 10 MG (21) PO TBPK: ORAL_TABLET | Freq: Every day | ORAL | 0 refills | Status: DC

## 2020-04-25 MED ORDER — PREDNISONE 50 MG PO TABS
60.0000 mg | ORAL_TABLET | Freq: Once | ORAL | Status: AC
  Administered 2020-04-25: 60 mg via ORAL
  Filled 2020-04-25: qty 1

## 2020-04-25 MED ORDER — AEROCHAMBER PLUS FLO-VU MEDIUM MISC
1.0000 | Freq: Once | Status: AC
  Administered 2020-04-25: 1
  Filled 2020-04-25 (×2): qty 1

## 2020-04-25 MED ORDER — ALBUTEROL SULFATE HFA 108 (90 BASE) MCG/ACT IN AERS
4.0000 | INHALATION_SPRAY | Freq: Once | RESPIRATORY_TRACT | Status: AC
  Administered 2020-04-25: 4 via RESPIRATORY_TRACT
  Filled 2020-04-25: qty 6.7

## 2020-04-25 NOTE — Discharge Instructions (Signed)
You were seen in the emergency department today for a cough with chest pain and trouble breathing.  Your chest x-ray was normal.  Your EKG did not show signs of a heart attack.  We suspect you have bronchitis.  We are sending you with prednisone to take daily starting tomorrow as a taper to help treat this.  Please use the albuterol inhaler 1 to 2 puffs every 4-6 hours as needed for shortness of breath/wheezing.  We have prescribed you new medication(s) today. Discuss the medications prescribed today with your pharmacist as they can have adverse effects and interactions with your other medicines including over the counter and prescribed medications. Seek medical evaluation if you start to experience new or abnormal symptoms after taking one of these medicines, seek care immediately if you start to experience difficulty breathing, feeling of your throat closing, facial swelling, or rash as these could be indications of a more serious allergic reaction  Please take Tylenol and or Motrin per over-the-counter dosing to help with any chest discomfort.  Stop smoking cigarettes and marijuana this will further irritate your condition.  Follow-up with your primary care provider within 3 days for reevaluation.  Return to the ER for new or worsening symptoms including but not limited to worsening pain, fever, increased trouble breathing, passing out, coughing up blood, or any other concerns.

## 2020-04-25 NOTE — ED Notes (Signed)
Report to [facility]

## 2020-04-25 NOTE — ED Triage Notes (Signed)
Pt reports cough x1 week, shortness of breath/intermittent chest pain for last several days. Pt reports "coughed up black stuff a few times."

## 2020-04-25 NOTE — ED Notes (Signed)
Pt reports he smokes one ppd of cigarettes   As well as 2-3 gms of marijuana daily  Has a 4 day hx of coughing up "green and black stuff"  Reports took Covid vaccine in march [place])  Here for eval

## 2020-04-25 NOTE — ED Provider Notes (Signed)
[facility] EMERGENCY DEPARTMENT Provider Note   CSN: [id number] Arrival date & time: 04/25/20  1634     History Chief Complaint  Patient presents with  . Cough    Jeffrey Hunt is a 24 y.o. male with a history of tobacco abuse, marijuana use, ADHD, bipolar disorder, seizures, and allergic rhinitis who presents to the emergency department with complaints of cough for the past 4 to 5 days. Patient states he is coughing up yellow to green to black phlegm with associated dyspnea, wheezing, and right-sided chest pain. Right-sided chest pain is constant, sharp, worse with coughing, no alleviating factors. No intervention prior to arrival. He denies fever, chills, leg pain/swelling, hemoptysis, recent surgery/trauma, recent long travel, hormone use, personal hx of cancer, or hx of DVT/PE. He denies history of IVDU. He received his [employer] COVID-19 vaccine.  HPI     Past Medical History:  Diagnosis Date  . ADHD (attention deficit hyperactivity disorder)   . Autistic disorder   . Bipolar 1 disorder (HCC)   . Bipolar 2 disorder (HCC)   . Seizures (HCC)    when younger    Patient Active Problem List   Diagnosis Date Noted  . Allergic rhinitis 02/07/2013  . Mood disorder (HCC) 02/07/2013  . ADHD (attention deficit hyperactivity disorder) 02/07/2013    History reviewed. No pertinent surgical history.     History reviewed. No pertinent family history.  Social History   Tobacco Use  . Smoking status: Current Every Day Smoker    Packs/day: 2.00    Types: Cigarettes  . Smokeless tobacco: Former Neurosurgeon    Types: Snuff  Vaping Use  . Vaping Use: Never used  Substance Use Topics  . Alcohol use: Yes    Comment: occasionally  . Drug use: Yes    Frequency: 7.0 times per week    Types: Marijuana    Comment: CBD weed    Home Medications Prior to Admission medications   Medication Sig Start Date End Date Taking? Authorizing Provider  doxycycline (VIBRAMYCIN) 100 MG  capsule Take 1 capsule (100 mg total) by mouth 2 (two) times daily. Patient not taking: Reported on 11/07/2019 03/06/18   [name], MD  mirtazapine (REMERON) 15 MG tablet Take 15 mg by mouth at bedtime.    [provider]  ondansetron (ZOFRAN ODT) 4 MG disintegrating tablet Take 1 tablet (4 mg total) by mouth every 8 (eight) hours as needed for nausea or vomiting. Patient not taking: Reported on 11/07/2019 03/11/19   Horton, Mayer Masker, MD  QUEtiapine (SEROQUEL XR) 300 MG 24 hr tablet Take 300 mg by mouth at bedtime. Pt out of medication    [provider]  QUEtiapine (SEROQUEL) 100 MG tablet Take 150 mg by mouth daily. Take 150mg  tablet po each evening. 10/04/19   [provider]    Allergies    Other and Stimulant laxative [bisacodyl]  Review of Systems   Review of Systems  Constitutional: Negative for chills and fever.  HENT: Negative for ear pain and sore throat.   Respiratory: Positive for cough and shortness of breath.   Cardiovascular: Positive for chest pain. Negative for leg swelling.  Gastrointestinal: Negative for abdominal pain, diarrhea, nausea and vomiting.  Neurological: Negative for syncope.  All other systems reviewed and are negative.   Physical Exam Updated Vital Signs BP 125/75   Pulse 96   Temp 97.9 F (36.6 C) (Oral)   Resp 18   Ht 5\' 9"  (1.753 m)  Wt 63 kg   SpO2 95%   BMI 20.51 kg/m   Physical Exam Vitals and nursing note reviewed.  Constitutional:      General: He is not in acute distress.    Appearance: He is well-developed. He is not toxic-appearing.  HENT:     Head: Normocephalic and atraumatic.  Eyes:     General:        Right eye: No discharge.        Left eye: No discharge.     Conjunctiva/sclera: Conjunctivae normal.  Cardiovascular:     Rate and Rhythm: Normal rate and regular rhythm.  Pulmonary:     Effort: Pulmonary effort is normal. No respiratory distress.     Breath sounds: Wheezing (biphasic  throughout with poor air movement. ) present. No rhonchi or rales.  Chest:     Chest wall: Tenderness (right chest wall which reproduces patient's chest pain) present.  Abdominal:     General: There is no distension.     Palpations: Abdomen is soft.     Tenderness: There is no abdominal tenderness. There is no guarding or rebound.  Musculoskeletal:        General: No tenderness.     Cervical back: Neck supple.     Right lower leg: No edema.     Left lower leg: No edema.  Skin:    General: Skin is warm and dry.     Findings: No rash.  Neurological:     Mental Status: He is alert.     Comments: Clear speech.   Psychiatric:        Behavior: Behavior normal.    ED Results / Procedures / Treatments   Labs (all labs ordered are listed, but only abnormal results are displayed) Labs Reviewed  SARS CORONAVIRUS 2 BY RT PCR (HOSPITAL ORDER, PERFORMED IN [facility] LAB)    EKG None  Radiology DG Chest 2 View  Result Date: 04/25/2020 CLINICAL DATA:  Dyspnea EXAM: CHEST - 2 VIEW COMPARISON:  11/07/2019 FINDINGS: The heart size and mediastinal contours are within normal limits. Both lungs are clear. The visualized skeletal structures are unremarkable. IMPRESSION: No active cardiopulmonary disease. Electronically Signed   By: Helyn Numbers MD   On: 04/25/2020 19:07    Procedures Procedures (including critical care time)  Medications Ordered in ED Medications  predniSONE (DELTASONE) tablet 60 mg (has no administration in time range)  ipratropium-albuterol (DUONEB) 0.5-2.5 (3) MG/3ML nebulizer solution 3 mL (has no administration in time range)  albuterol (VENTOLIN HFA) 108 (90 Base) MCG/ACT inhaler 4 puff (4 puffs Inhalation Given 04/25/20 2147)  AeroChamber Plus Flo-Vu Medium MISC 1 each (1 each Other Given 04/25/20 2147)    ED Course  I have reviewed the triage vital signs and the nursing notes.  Pertinent labs & imaging results that were available during my care of the  patient were reviewed by me and considered in my medical decision making (see chart for details).    Jeffrey Hunt was evaluated in Emergency Department on 04/25/2020 for the symptoms described in the history of present illness. He/she was evaluated in the context of the global COVID-19 pandemic, which necessitated consideration that the patient might be at risk for infection with the SARS-CoV-2 virus that causes COVID-19. Institutional protocols and algorithms that pertain to the evaluation of patients at risk for COVID-19 are in a state of rapid change based on information released by regulatory bodies including the CDC and federal and state organizations. These  policies and algorithms were followed during the patient's care in the ED.  MDM Rules/Calculators/A&P                         Patient presents to the ED with complaints of cough, dyspnea, wheezing, and chest pain. Patient is nontoxic, resting comfortably, his vitals are within normal limits on my assessment. He does have biphasic wheezing noted throughout with poor air movement. He does not appear to be in respiratory distress.  Additional history obtained:  Additional history obtained from chart review and nursing note reviewed. Lab Tests:  I Ordered, reviewed, and interpreted labs, which included:  Covid testing: Negative Imaging Studies ordered:  I ordered imaging studies which included CXR, I independently visualized and interpreted imaging which showed no acute process.   EKG without significant ischemic changes, chest pain reproducible with palpation, doubt ACS. Patient is low risk [place], doubt PE. He has no findings of pneumonia or pneumothorax on his chest x-ray. His Covid test is negative. Suspect he has a degree of bronchitis. Given albuterol inhaler following initial assessment with some mild symptomatic improvement, however remains with biphasic wheezing and poor air movement on exam. DuoNeb ordered as well as prednisone.    23:07: RE-EVAL: Patient feeling much better, ambulatory SPO2 remains greater than 95% on room air, he does still have some expiratory wheezing present, we discussed the option of further treatment in the ER versus discharge home, he would like to go home at this time which I feel is reasonable given he is not in any respiratory distress.  We will send him home with a prednisone taper as well as the albuterol inhaler.  We discussed smoking cessation and how this is likely contributing to his current condition. I discussed results, treatment plan, need for follow-up, and return precautions with the patient. Provided opportunity for questions, patient confirmed understanding and is in agreement with plan.   Portions of this note were generated with Scientist, clinical (histocompatibility and immunogenetics). Dictation errors may occur despite best attempts at proofreading.  Final Clinical Impression(s) / ED Diagnoses Final diagnoses:  Bronchitis  Tobacco abuse    Rx / DC Orders ED Discharge Orders         Ordered    predniSONE (STERAPRED UNI-PAK 21 TAB) 10 MG (21) TBPK tablet  Daily     Discontinue  Reprint     04/25/20 2306           Cherly Anderson, PA-C 04/25/20 2311    Benjiman Core, MD 04/25/20 2344

## 2020-10-10 ENCOUNTER — Other Ambulatory Visit: Payer: Self-pay

## 2020-10-10 ENCOUNTER — Emergency Department (HOSPITAL_COMMUNITY)
Admission: EM | Admit: 2020-10-10 | Discharge: 2020-10-10 | Disposition: A | Discharge status: Home / Self Care | Payer: Medicaid Other | Attending: Emergency Medicine | Admitting: Emergency Medicine

## 2020-10-10 ENCOUNTER — Emergency Department (HOSPITAL_COMMUNITY): Payer: Medicaid Other

## 2020-10-10 ENCOUNTER — Encounter (HOSPITAL_COMMUNITY): Payer: Self-pay | Admitting: Emergency Medicine

## 2020-10-10 DIAGNOSIS — J40 Bronchitis, not specified as acute or chronic: Secondary | ICD-10-CM | POA: Insufficient documentation

## 2020-10-10 DIAGNOSIS — Z20822 Contact with and (suspected) exposure to covid-19: Secondary | ICD-10-CM | POA: Insufficient documentation

## 2020-10-10 DIAGNOSIS — R059 Cough, unspecified: Secondary | ICD-10-CM | POA: Diagnosis present

## 2020-10-10 DIAGNOSIS — F840 Autistic disorder: Secondary | ICD-10-CM | POA: Diagnosis not present

## 2020-10-10 DIAGNOSIS — F17210 Nicotine dependence, cigarettes, uncomplicated: Secondary | ICD-10-CM | POA: Diagnosis not present

## 2020-10-10 DIAGNOSIS — F1590 Other stimulant use, unspecified, uncomplicated: Secondary | ICD-10-CM | POA: Diagnosis not present

## 2020-10-10 LAB — RESP PANEL BY RT-PCR (FLU A&B, COVID) ARPGX2
Influenza A by PCR: NEGATIVE
Influenza B by PCR: NEGATIVE
SARS Coronavirus 2 by RT PCR: NEGATIVE

## 2020-10-10 MED ORDER — PREDNISONE 10 MG (21) PO TBPK: ORAL_TABLET | Freq: Every day | ORAL | 0 refills | Status: DC

## 2020-10-10 MED ORDER — ALBUTEROL SULFATE HFA 108 (90 BASE) MCG/ACT IN AERS
8.0000 | INHALATION_SPRAY | Freq: Once | RESPIRATORY_TRACT | Status: AC
  Administered 2020-10-10: 8 via RESPIRATORY_TRACT
  Filled 2020-10-10 (×2): qty 6.7

## 2020-10-10 MED ORDER — PREDNISONE 50 MG PO TABS
60.0000 mg | ORAL_TABLET | Freq: Once | ORAL | Status: AC
  Administered 2020-10-10: 60 mg via ORAL
  Filled 2020-10-10: qty 1

## 2020-10-10 NOTE — ED Triage Notes (Addendum)
Pt states a approx 4 days ago he drank a pepsi that had cherry flavor. States he is allergic to cherry.  Reports nausea, dizziness, shortness of breath, low energy. Also reports he has not ate anything in 4 days, he has drank liquids.

## 2020-10-10 NOTE — Discharge Instructions (Signed)
Your xray showed signs of bronchitis. Please pick up prednisone and take as prescribed. For wheezing you can use the albuterol inhaler as needed (2 puffs ever 4 hours).   Follow up with your PCP regarding your ED visit today. If you do not have one you can follow up with [facility] for primary care needs.   Return to the ED for any worsening symptoms

## 2020-10-10 NOTE — ED Provider Notes (Signed)
[facility] EMERGENCY DEPARTMENT Provider Note   CSN: [id number] Arrival date & time: 10/10/20  1725     History No chief complaint on file.   Jeffrey Hunt is a 23 y.o. male with PMHx ADHD, bipolar disorder who presents to the ED today with complaint of fatigue for the past 4 days. Pt reports that 5 days ago he drank 1/2 a cherry pepsi. He is allergic to cherry and was unaware that it was cherry flavored. He states that his friend told him it was cherry flavored once he had drank about 1/2 of the drink. The next day he woke up fatigued with sore throat, headache, cough, shortness of breath, wheezing, and nausea. He assumed he was having an allergic reaction to the cherry as he "never gets sick." Pt states he tried to go to work today but felt fatigued and lightheaded prompting him to leave work early and come to the ED instead. He denies fevers or chills. He is vaccinated against COVID with J&J vaccine without booster. Denies throat swelling, tongue swelling, hives, abdominal pain, vomiting.   The history is provided by the patient and medical records.       Past Medical History:  Diagnosis Date  . ADHD (attention deficit hyperactivity disorder)   . Autistic disorder   . Bipolar 1 disorder (HCC)   . Bipolar 2 disorder (HCC)   . Seizures (HCC)    when younger    Patient Active Problem List   Diagnosis Date Noted  . Allergic rhinitis 02/07/2013  . Mood disorder (HCC) 02/07/2013  . ADHD (attention deficit hyperactivity disorder) 02/07/2013    History reviewed. No pertinent surgical history.     No family history on file.  Social History   Tobacco Use  . Smoking status: Current Every Day Smoker    Packs/day: 2.00    Types: Cigarettes  . Smokeless tobacco: Former Neurosurgeon    Types: Snuff  Vaping Use  . Vaping Use: Never used  Substance Use Topics  . Alcohol use: Yes    Comment: occasionally  . Drug use: Yes    Frequency: 7.0 times per week    Types: Marijuana     Comment: CBD weed    Home Medications Prior to Admission medications   Medication Sig Start Date End Date Taking? Authorizing Provider  acetaminophen (TYLENOL) 500 MG tablet Take 1,000 mg by mouth every 6 (six) hours as needed for moderate pain, mild pain or headache.   Yes [provider]  predniSONE (STERAPRED UNI-PAK 21 TAB) 10 MG (21) TBPK tablet Take by mouth daily. Take 6 tabs by mouth daily  for 2 days, then 5 tabs for 2 days, then 4 tabs for 2 days, then 3 tabs for 2 days, 2 tabs for 2 days, then 1 tab by mouth daily for 2 days 10/10/20  Yes Hyman Hopes, Glover Capano, PA-C  doxycycline (VIBRAMYCIN) 100 MG capsule Take 1 capsule (100 mg total) by mouth 2 (two) times daily. Patient not taking: No sig reported 03/06/18   [name], MD  ondansetron (ZOFRAN ODT) 4 MG disintegrating tablet Take 1 tablet (4 mg total) by mouth every 8 (eight) hours as needed for nausea or vomiting. Patient not taking: No sig reported 03/11/19   Horton, Mayer Masker, MD  predniSONE (STERAPRED UNI-PAK 21 TAB) 10 MG (21) TBPK tablet Take by mouth daily. 6, 5, 4, 3, 2, 1 take as written Patient not taking: Reported on 10/10/2020 04/25/20   Petrucelli, Pleas Koch, PA-C  Allergies    Cherry, Other, and Stimulant laxative [bisacodyl]  Review of Systems   Review of Systems  Constitutional: Positive for appetite change and fatigue. Negative for chills and fever.  HENT: Positive for sore throat. Negative for trouble swallowing.   Eyes: Negative for visual disturbance.  Respiratory: Positive for cough, shortness of breath and wheezing.   Cardiovascular: Negative for chest pain.  Gastrointestinal: Negative for abdominal pain, diarrhea, nausea and vomiting.  Musculoskeletal: Positive for myalgias. Negative for neck pain and neck stiffness.  Neurological: Positive for headaches.  All other systems reviewed and are negative.   Physical Exam Updated Vital Signs BP 115/82 (BP Location: Right Arm)   Pulse (!) 125    Temp (!) 97.5 F (36.4 C) (Oral)   Resp 18   Ht 5\' 9"  (1.753 m)   SpO2 94%   BMI 20.51 kg/m   Physical Exam Vitals and nursing note reviewed.  Constitutional:      Appearance: He is not ill-appearing or diaphoretic.  HENT:     Head: Normocephalic and atraumatic.     Mouth/Throat:     Mouth: Mucous membranes are moist.     Pharynx: Posterior oropharyngeal erythema present. No oropharyngeal exudate.  Eyes:     Conjunctiva/sclera: Conjunctivae normal.  Cardiovascular:     Rate and Rhythm: Normal rate and regular rhythm.  Pulmonary:     Effort: Pulmonary effort is normal.     Breath sounds: Wheezing present. No rhonchi or rales.     Comments: Speaking in full sentences without difficulty. Actively coughing. Lungs with end expiratory wheezes diffusely. Satting 96% on RA.  Abdominal:     Palpations: Abdomen is soft.     Tenderness: There is no abdominal tenderness. There is no guarding or rebound.  Musculoskeletal:     Cervical back: Neck supple.  Skin:    General: Skin is warm and dry.  Neurological:     Mental Status: He is alert.     ED Results / Procedures / Treatments   Labs (all labs ordered are listed, but only abnormal results are displayed) Labs Reviewed  RESP PANEL BY RT-PCR (FLU A&B, COVID) [id number]    EKG None  Radiology DG Chest Port 1 View  Result Date: 10/10/2020 CLINICAL DATA:  Cough and shortness of breath.  Likely COVID. EXAM: PORTABLE CHEST 1 VIEW COMPARISON:  04/25/2020 FINDINGS: Peribronchial thickening and streaky perihilar interstitial prominence may represent early changes of viral pneumonia. No focal airspace disease or consolidation. No pleural effusions. No pneumothorax. Mediastinal contours appear intact. IMPRESSION: Peribronchial thickening and streaky perihilar interstitial prominence may represent early changes of viral pneumonia. Electronically Signed   By: 04/27/2020 M.D.   On: 10/10/2020 18:47    Procedures Procedures (including  critical care time)  Medications Ordered in ED Medications  predniSONE (DELTASONE) tablet 60 mg (has no administration in time range)  albuterol (VENTOLIN HFA) 108 (90 Base) MCG/ACT inhaler 8 puff (8 puffs Inhalation Given 10/10/20 2028)    ED Course  I have reviewed the triage vital signs and the nursing notes.  Pertinent labs & imaging results that were available during my care of the patient were reviewed by me and considered in my medical decision making (see chart for details).    MDM Rules/Calculators/A&P                          24 year old male resents to the ED with URI-like symptoms for the past 4 days.  He states he thinks he had an allergic reaction to drinking cherry Pepsi the day before however denies any hives, throat swelling, facial swelling.  On arrival to the ED vitals are stable.  Patient initially tachycardic in the waiting room however when he returned to the room he is in normal sinus rhythm.  He appears to be in no acute distress.  He is satting 96% on room air and able to speak in full sentences without difficulty.  He does however have wheezes on exam.  We will plan for albuterol inhaler and chest x-ray.  Will swab for Covid at this time given his URI-like symptoms.  We will also obtain EKG as patient states he feels short of breath however he does not appear to be working hard to breathe at this time.   EKG without acute ischemic changes. Chest x-ray with concerns for bronchitis versus viral pneumonia.  Still awaiting Covid test.  Staff informs me that patient states he could not take the albuterol inhaler as it has "stimulant" and it.  I had discussion with patient, states that he did not like the taste of it and it made him feel a little strange.  Will hold off on giving it at this time, will provide prednisone for wheezing.  Covid and flu test negative.  Even patient's overall well appearance we will plan to discharge home at this time.  We will plan to give  prednisone taper outpatient.  With further discussion regarding albuterol inhaler patient states he is willing to try it at home.  We will send as needed.  Patient instructed to follow-up with his PCP.  He is in agreement plan stable for discharge   This note was prepared using Dragon voice recognition software and may include unintentional dictation errors due to the inherent limitations of voice recognition software.  Jeffrey Hunt was evaluated in Emergency Department on 10/10/2020 for the symptoms described in the history of present illness. He was evaluated in the context of the global COVID-19 pandemic, which necessitated consideration that the patient might be at risk for infection with the SARS-CoV-2 virus that causes COVID-19. Institutional protocols and algorithms that pertain to the evaluation of patients at risk for COVID-19 are in a state of rapid change based on information released by regulatory bodies including the CDC and federal and state organizations. These policies and algorithms were followed during the patient's care in the ED.  Final Clinical Impression(s) / ED Diagnoses Final diagnoses:  Bronchitis    Rx / DC Orders ED Discharge Orders         Ordered    predniSONE (STERAPRED UNI-PAK 21 TAB) 10 MG (21) TBPK tablet  Daily        10/10/20 2028           Discharge Instructions     Your xray showed signs of bronchitis. Please pick up prednisone and take as prescribed. For wheezing you can use the albuterol inhaler as needed (2 puffs ever 4 hours).   Follow up with your PCP regarding your ED visit today. If you do not have one you can follow up with [facility] for primary care needs.   Return to the ED for any worsening symptoms       Tanda Rockers, Cordelia Poche 10/10/20 2029    Derwood Kaplan, MD 10/12/20 2256

## 2020-12-20 ENCOUNTER — Other Ambulatory Visit: Payer: Self-pay

## 2020-12-20 ENCOUNTER — Emergency Department (HOSPITAL_COMMUNITY)
Admission: EM | Admit: 2020-12-20 | Discharge: 2020-12-20 | Disposition: A | Discharge status: Home / Self Care | Payer: Medicaid Other | Attending: Emergency Medicine | Admitting: Emergency Medicine

## 2020-12-20 ENCOUNTER — Encounter (HOSPITAL_COMMUNITY): Payer: Self-pay | Admitting: Emergency Medicine

## 2020-12-20 ENCOUNTER — Emergency Department (HOSPITAL_COMMUNITY): Payer: Medicaid Other

## 2020-12-20 DIAGNOSIS — G8929 Other chronic pain: Secondary | ICD-10-CM | POA: Diagnosis not present

## 2020-12-20 DIAGNOSIS — F17210 Nicotine dependence, cigarettes, uncomplicated: Secondary | ICD-10-CM | POA: Insufficient documentation

## 2020-12-20 DIAGNOSIS — M546 Pain in thoracic spine: Secondary | ICD-10-CM | POA: Insufficient documentation

## 2020-12-20 DIAGNOSIS — F840 Autistic disorder: Secondary | ICD-10-CM | POA: Diagnosis not present

## 2020-12-20 MED ORDER — IBUPROFEN 800 MG PO TABS: 800.0000 mg | ORAL_TABLET | Freq: Three times a day (TID) | ORAL | 0 refills | Status: DC | PRN

## 2020-12-20 NOTE — ED Notes (Signed)
ED Provider at bedside. 

## 2020-12-20 NOTE — Discharge Instructions (Signed)
Return if any problems.

## 2020-12-20 NOTE — ED Notes (Signed)
Entered room and introduced self to patient. Pt appears to be resting in chair, respirations are even and unlabored with equal chest rise and fall. Call bell within reach. Pt educated on call light use and hourly rounding, verbalized understanding and in agreement at this time. All questions and concerns voiced addressed. Refreshments offered and provided per patient request.  

## 2020-12-20 NOTE — ED Triage Notes (Signed)
Pt states he has has middle back pain for the last five years.  Pt has no PCP and has never been to a specialist.  Pt left work early today and needs a doctors note.

## 2020-12-21 NOTE — ED Provider Notes (Signed)
[facility] EMERGENCY DEPARTMENT Provider Note   CSN: [id number] Arrival date & time: 12/20/20  1031     History Chief Complaint  Patient presents with  . Back Pain    Jeffrey Hunt is a 25 y.o. male.  The history is provided by the patient. No language interpreter was used.  Back Pain Location:  Thoracic spine Quality:  Aching Radiates to:  Does not radiate Pain severity:  Moderate Onset quality:  Gradual Timing:  Constant Progression:  Worsening Chronicity:  New Context: not recent injury   Relieved by:  Nothing Worsened by:  Nothing Ineffective treatments:  None tried Associated symptoms: no leg pain    Pt reprts he has had pain in his back in the same area for his whole life.  Pt reports increased pain.    Past Medical History:  Diagnosis Date  . ADHD (attention deficit hyperactivity disorder)   . Autistic disorder   . Bipolar 1 disorder (HCC)   . Bipolar 2 disorder (HCC)   . Seizures (HCC)    when younger    Patient Active Problem List   Diagnosis Date Noted  . Allergic rhinitis 02/07/2013  . Mood disorder (HCC) 02/07/2013  . ADHD (attention deficit hyperactivity disorder) 02/07/2013    History reviewed. No pertinent surgical history.     History reviewed. No pertinent family history.  Social History   Tobacco Use  . Smoking status: Current Every Day Smoker    Packs/day: 1.00    Types: Cigarettes  . Smokeless tobacco: Former Neurosurgeon    Types: Snuff  Vaping Use  . Vaping Use: Never used  Substance Use Topics  . Alcohol use: Yes    Comment: occasionally  . Drug use: Yes    Frequency: 7.0 times per week    Types: Marijuana    Comment: CBD/ weed    Home Medications Prior to Admission medications   Medication Sig Start Date End Date Taking? Authorizing Provider  ibuprofen (ADVIL) 800 MG tablet Take 1 tablet (800 mg total) by mouth every 8 (eight) hours as needed. 12/20/20  Yes Elson Areas, PA-C    Allergies    Cherry, Other, and  Stimulant laxative [bisacodyl]  Review of Systems   Review of Systems  Musculoskeletal: Positive for back pain.  All other systems reviewed and are negative.   Physical Exam Updated Vital Signs BP 123/66 (BP Location: Right Arm)   Pulse (!) 51   Temp 98 F (36.7 C) (Oral)   Resp 16   Ht 5\' 10"  (1.778 m)   Wt 79.4 kg   SpO2 100%   BMI 25.11 kg/m   Physical Exam Vitals and nursing note reviewed.  Constitutional:      Appearance: He is well-developed.  HENT:     Head: Normocephalic and atraumatic.  Eyes:     Conjunctiva/sclera: Conjunctivae normal.  Cardiovascular:     Rate and Rhythm: Normal rate and regular rhythm.     Heart sounds: No murmur heard.   Pulmonary:     Effort: Pulmonary effort is normal. No respiratory distress.     Breath sounds: Normal breath sounds.  Abdominal:     Palpations: Abdomen is soft.     Tenderness: There is no abdominal tenderness.  Musculoskeletal:        General: Normal range of motion.     Cervical back: Neck supple.     Comments: Mid thoracic pain,  Pain with movement  nv and ns intact   Skin:  General: Skin is warm and dry.  Neurological:     Mental Status: He is alert.     ED Results / Procedures / Treatments   Labs (all labs ordered are listed, but only abnormal results are displayed) Labs Reviewed - No data to display  EKG None  Radiology DG Thoracic Spine 2 View  Result Date: 12/20/2020 CLINICAL DATA:  Dorsalgia EXAM: THORACIC SPINE 3 VIEWS COMPARISON:  None. FINDINGS: Frontal, lateral, and swimmer's views were obtained. No fracture or spondylolisthesis. Disc spaces appear normal. No erosive change or paraspinous lesion. Visualized lungs clear. IMPRESSION: No fracture or spondylolisthesis.  No evident arthropathic change. Electronically Signed   By: Bretta Bang III M.D.   On: 12/20/2020 12:58    Procedures Procedures   Medications Ordered in ED Medications - No data to display  ED Course  I have  reviewed the triage vital signs and the nursing notes.  Pertinent labs & imaging results that were available during my care of the patient were reviewed by me and considered in my medical decision making (see chart for details).    MDM Rules/Calculators/A&P                           Final Clinical Impression(s) / ED Diagnoses Final diagnoses:  Chronic midline thoracic back pain    Rx / DC Orders ED Discharge Orders         Ordered    ibuprofen (ADVIL) 800 MG tablet  Every 8 hours PRN        12/20/20 1348        An After Visit Summary was printed and given to the patient.    Elson Areas, PA-C 12/21/20 [id number]    Rozelle Logan, DO 12/21/20 1114

## 2021-01-12 ENCOUNTER — Emergency Department (HOSPITAL_COMMUNITY): Payer: Medicaid Other

## 2021-01-12 ENCOUNTER — Emergency Department (HOSPITAL_COMMUNITY)
Admission: EM | Admit: 2021-01-12 | Discharge: 2021-01-12 | Discharge status: Against Medical Advice | Payer: Medicaid Other | Attending: Emergency Medicine | Admitting: Emergency Medicine

## 2021-01-12 ENCOUNTER — Encounter (HOSPITAL_COMMUNITY): Payer: Self-pay | Admitting: Emergency Medicine

## 2021-01-12 ENCOUNTER — Other Ambulatory Visit: Payer: Self-pay

## 2021-01-12 DIAGNOSIS — F17210 Nicotine dependence, cigarettes, uncomplicated: Secondary | ICD-10-CM | POA: Diagnosis not present

## 2021-01-12 DIAGNOSIS — R1084 Generalized abdominal pain: Secondary | ICD-10-CM | POA: Diagnosis present

## 2021-01-12 DIAGNOSIS — F840 Autistic disorder: Secondary | ICD-10-CM | POA: Insufficient documentation

## 2021-01-12 DIAGNOSIS — K759 Inflammatory liver disease, unspecified: Secondary | ICD-10-CM

## 2021-01-12 LAB — CBC WITH DIFFERENTIAL/PLATELET
Abs Immature Granulocytes: 0.03 10*3/uL (ref 0.00–0.07)
Basophils Absolute: 0 10*3/uL (ref 0.0–0.1)
Basophils Relative: 0 %
Eosinophils Absolute: 0.1 10*3/uL (ref 0.0–0.5)
Eosinophils Relative: 1 %
HCT: 47.9 % (ref 39.0–52.0)
Hemoglobin: 16.4 g/dL (ref 13.0–17.0)
Immature Granulocytes: 0 %
Lymphocytes Relative: 12 %
Lymphs Abs: 0.9 10*3/uL (ref 0.7–4.0)
MCH: 29.5 pg (ref 26.0–34.0)
MCHC: 34.2 g/dL (ref 30.0–36.0)
MCV: 86.3 fL (ref 80.0–100.0)
Monocytes Absolute: 0.6 10*3/uL (ref 0.1–1.0)
Monocytes Relative: 7 %
Neutro Abs: 5.8 10*3/uL (ref 1.7–7.7)
Neutrophils Relative %: 80 %
Platelets: 110 10*3/uL — ABNORMAL LOW (ref 150–400)
RBC: 5.55 MIL/uL (ref 4.22–5.81)
RDW: 14.1 % (ref 11.5–15.5)
WBC: 7.4 10*3/uL (ref 4.0–10.5)
nRBC: 0 % (ref 0.0–0.2)

## 2021-01-12 LAB — COMPREHENSIVE METABOLIC PANEL
ALT: 9034 U/L — ABNORMAL HIGH (ref 0–44)
AST: 7393 U/L — ABNORMAL HIGH (ref 15–41)
Albumin: 4.5 g/dL (ref 3.5–5.0)
Alkaline Phosphatase: 101 U/L (ref 38–126)
Anion gap: 12 (ref 5–15)
BUN: 26 mg/dL — ABNORMAL HIGH (ref 6–20)
CO2: 23 mmol/L (ref 22–32)
Calcium: 9.1 mg/dL (ref 8.9–10.3)
Chloride: 100 mmol/L (ref 98–111)
Creatinine, Ser: 1.01 mg/dL (ref 0.61–1.24)
GFR, Estimated: >60 mL/min (ref >60)
Glucose, Bld: 86 mg/dL (ref 70–99)
Potassium: 3.9 mmol/L (ref 3.5–5.1)
Sodium: 135 mmol/L (ref 135–145)
Total Bilirubin: 1.9 mg/dL — ABNORMAL HIGH (ref 0.3–1.2)
Total Protein: 7.2 g/dL (ref 6.5–8.1)

## 2021-01-12 LAB — URINALYSIS, ROUTINE W REFLEX MICROSCOPIC
Bilirubin Urine: NEGATIVE
Cellular Cast, UA: 11
Glucose, UA: NEGATIVE mg/dL
Ketones, ur: 20 mg/dL — AB
Leukocytes,Ua: NEGATIVE
Nitrite: NEGATIVE
Protein, ur: 100 mg/dL — AB
Specific Gravity, Urine: 1.028 (ref 1.005–1.030)
pH: 5 (ref 5.0–8.0)

## 2021-01-12 LAB — PROTIME-INR
INR: 1.8 — ABNORMAL HIGH (ref 0.8–1.2)
Prothrombin Time: 19.9 seconds — ABNORMAL HIGH (ref 11.4–15.2)

## 2021-01-12 LAB — LIPASE, BLOOD: Lipase: 57 U/L — ABNORMAL HIGH (ref 11–51)

## 2021-01-12 LAB — AMMONIA: Ammonia: 17 umol/L (ref 9–35)

## 2021-01-12 MED ORDER — IOHEXOL 300 MG/ML  SOLN
80.0000 mL | Freq: Once | INTRAMUSCULAR | Status: AC | PRN
  Administered 2021-01-12: 80 mL via INTRAVENOUS

## 2021-01-12 NOTE — ED Triage Notes (Signed)
Pt c/o abdominal pain and N/V x 4 days

## 2021-01-12 NOTE — ED Provider Notes (Signed)
[facility] EMERGENCY DEPARTMENT Provider Note   CSN: [id number] Arrival date & time: 01/12/21  1337     History Chief Complaint  Patient presents with  . Abdominal Pain    Jeffrey Hunt is a 25 y.o. male.  The history is provided by the patient. No language interpreter was used.  Abdominal Pain Pain location:  Generalized Pain quality: aching   Pain radiates to:  Does not radiate Pain severity:  Moderate Onset quality:  Gradual Duration:  4 days Timing:  Constant Progression:  Worsening Chronicity:  New Relieved by:  Nothing Worsened by:  Nothing Ineffective treatments:  None tried Associated symptoms: nausea   Pt reports he has had pancreatitis in the past and feels the same way.      Past Medical History:  Diagnosis Date  . ADHD (attention deficit hyperactivity disorder)   . Autistic disorder   . Bipolar 1 disorder (HCC)   . Bipolar 2 disorder (HCC)   . Seizures (HCC)    when younger    Patient Active Problem List   Diagnosis Date Noted  . Allergic rhinitis 02/07/2013  . Mood disorder (HCC) 02/07/2013  . ADHD (attention deficit hyperactivity disorder) 02/07/2013    History reviewed. No pertinent surgical history.     No family history on file.  Social History   Tobacco Use  . Smoking status: Current Every Day Smoker    Packs/day: 1.00    Types: Cigarettes  . Smokeless tobacco: Former Neurosurgeon    Types: Snuff  Vaping Use  . Vaping Use: Never used  Substance Use Topics  . Alcohol use: Yes    Comment: occasionally  . Drug use: Yes    Frequency: 7.0 times per week    Types: Marijuana    Comment: CBD/ weed    Home Medications Prior to Admission medications   Medication Sig Start Date End Date Taking? Authorizing Provider  ibuprofen (ADVIL) 800 MG tablet Take 1 tablet (800 mg total) by mouth every 8 (eight) hours as needed. 12/20/20   Elson Areas, PA-C    Allergies    Cherry, Other, and Stimulant laxative [bisacodyl]  Review of  Systems   Review of Systems  Gastrointestinal: Positive for abdominal pain and nausea.  All other systems reviewed and are negative.   Physical Exam Updated Vital Signs BP 119/76   Pulse 64   Temp 97.9 F (36.6 C) (Oral)   Resp 18   Ht 5\' 9"  (1.753 m)   Wt 72.6 kg   SpO2 100%   BMI 23.63 kg/m   Physical Exam Vitals and nursing note reviewed.  Constitutional:      Appearance: He is well-developed.  HENT:     Head: Normocephalic and atraumatic.  Eyes:     Conjunctiva/sclera: Conjunctivae normal.  Cardiovascular:     Rate and Rhythm: Normal rate and regular rhythm.     Heart sounds: No murmur heard.   Pulmonary:     Effort: Pulmonary effort is normal. No respiratory distress.     Breath sounds: Normal breath sounds.  Abdominal:     General: Abdomen is flat. Bowel sounds are normal. There is no distension.     Palpations: Abdomen is soft.     Tenderness: There is generalized abdominal tenderness.  Musculoskeletal:     Cervical back: Neck supple.  Skin:    General: Skin is warm and dry.  Neurological:     General: No focal deficit present.     Mental Status: He  is alert.     ED Results / Procedures / Treatments   Labs (all labs ordered are listed, but only abnormal results are displayed) Labs Reviewed  COMPREHENSIVE METABOLIC PANEL - Abnormal; Notable for the following components:      Result Value   BUN 26 (*)    AST 7,393 (*)    ALT 9,034 (*)    Total Bilirubin 1.9 (*)    All other components within normal limits  LIPASE, BLOOD - Abnormal; Notable for the following components:   Lipase 57 (*)    All other components within normal limits  CBC WITH DIFFERENTIAL/PLATELET - Abnormal; Notable for the following components:   Platelets 110 (*)    All other components within normal limits  URINALYSIS, ROUTINE W REFLEX MICROSCOPIC - Abnormal; Notable for the following components:   Color, Urine AMBER (*)    APPearance HAZY (*)    Hgb urine dipstick MODERATE  (*)    Ketones, ur 20 (*)    Protein, ur 100 (*)    Bacteria, UA FEW (*)    All other components within normal limits  PROTIME-INR - Abnormal; Notable for the following components:   Prothrombin Time 19.9 (*)    INR 1.8 (*)    All other components within normal limits  AMMONIA  HEPATITIS PANEL, ACUTE  ACETAMINOPHEN LEVEL    EKG None  Radiology US Abdomen Complete  Result Date: 01/12/2021 CLINICAL DATA:  Abdominal pain EXAM: ABDOMEN ULTRASOUND COMPLETE COMPARISON:  CT abdomen pelvis 02/21/2020 FINDINGS: Gallbladder: No gallstones or wall thickening visualized. No sonographic Murphy sign noted by sonographer. Common bile duct: Diameter: 6.4 mm Liver: 15 mm hyperechoic lesion in the right lobe of liver anteriorly compatible with hemangioma otherwise normal echogenicity liver. Portal vein is patent on color Doppler imaging with normal direction of blood flow towards the liver. IVC: No abnormality visualized. Pancreas: Visualized portion unremarkable. Spleen: Size and appearance within normal limits. Right Kidney: Length: 10.8 cm. Echogenicity within normal limits. No mass or hydronephrosis visualized. Left Kidney: Length: 10.9 cm. Echogenicity within normal limits. No mass or hydronephrosis visualized. Abdominal aorta: No aneurysm visualized. Other findings: None. IMPRESSION: Negative for gallstones Incidental 15 mm hemangioma in the anterior liver Electronically Signed   By: Marlan Palau M.D.   On: 01/12/2021 16:45   CT ABDOMEN PELVIS W CONTRAST  Result Date: 01/12/2021 CLINICAL DATA:  25 year old male with abdominal pain. EXAM: CT ABDOMEN AND PELVIS WITH CONTRAST TECHNIQUE: Multidetector CT imaging of the abdomen and pelvis was performed using the standard protocol following bolus administration of intravenous contrast. CONTRAST:  51mL OMNIPAQUE IOHEXOL 300 MG/ML  SOLN COMPARISON:  Abdominal ultrasound dated 01/12/2021. FINDINGS: Evaluation of this exam is limited due to respiratory motion  artifact. Lower chest: The visualized lung bases are clear. No intra-abdominal free air or free fluid. Hepatobiliary: No focal liver abnormality is seen. No gallstones, gallbladder wall thickening, or biliary dilatation. Pancreas: Unremarkable. No pancreatic ductal dilatation or surrounding inflammatory changes. Spleen: Normal in size without focal abnormality. Adrenals/Urinary Tract: The adrenal glands unremarkable. The kidneys, visualized ureters, and urinary bladder appear unremarkable. Stomach/Bowel: There is no bowel obstruction or active inflammation. The appendix is normal. Vascular/Lymphatic: The abdominal aorta and IVC unremarkable. No portal venous gas. There is no adenopathy. Reproductive: The prostate and seminal vesicles are grossly unremarkable. No pelvic mass. Other: None Musculoskeletal: Incomplete bony fusion of S1 posterior element. No acute osseous pathology. IMPRESSION: No acute intra-abdominal or pelvic pathology. Electronically Signed   By: Elgie Collard  M.D.   On: 01/12/2021 17:37    Procedures Procedures   Medications Ordered in ED Medications  iohexol (OMNIPAQUE) 300 MG/ML solution 80 mL (80 mLs Intravenous Contrast Given 01/12/21 1717)    ED Course  I have reviewed the triage vital signs and the nursing notes.  Pertinent labs & imaging results that were available during my care of the patient were reviewed by me and considered in my medical decision making (see chart for details).    MDM Rules/Calculators/A&P                          MDM:  Ultrasound gallbladder normal,  Ct no acute abnormality.  Pt denies hepatitis risk.  Pt denies tylenol/acetamenophin use.   Results discussed with pt and admission  (Pt left [facility], he told staff he had to leave because of his girlfriend.)  Final Clinical Impression(s) / ED Diagnoses Final diagnoses:  Hepatitis    Rx / DC Orders ED Discharge Orders    None       Osie Cheeks 01/12/21 Darleen Crocker,  MD 01/13/21 2228

## 2021-01-13 LAB — HEPATITIS PANEL, ACUTE
HCV Ab: NONREACTIVE
Hep A IgM: NONREACTIVE
Hep B C IgM: NONREACTIVE
Hepatitis B Surface Ag: NONREACTIVE

## 2021-04-24 ENCOUNTER — Encounter (HOSPITAL_COMMUNITY): Payer: Self-pay | Admitting: Emergency Medicine

## 2021-04-24 ENCOUNTER — Emergency Department (HOSPITAL_COMMUNITY)
Admission: EM | Admit: 2021-04-24 | Discharge: 2021-04-24 | Disposition: A | Discharge status: Home / Self Care | Payer: Medicaid Other | Attending: Physician Assistant | Admitting: Physician Assistant

## 2021-04-24 DIAGNOSIS — S0993XA Unspecified injury of face, initial encounter: Secondary | ICD-10-CM | POA: Diagnosis present

## 2021-04-24 DIAGNOSIS — Z5321 Procedure and treatment not carried out due to patient leaving prior to being seen by health care provider: Secondary | ICD-10-CM | POA: Diagnosis not present

## 2021-04-24 MED ORDER — LIDOCAINE-EPINEPHRINE (PF) 2 %-1:200000 IJ SOLN: 20.0000 mL | Freq: Once | INTRAMUSCULAR | Status: DC

## 2021-04-24 MED ORDER — TETANUS-DIPHTH-ACELL PERTUSSIS 5-2.5-18.5 LF-MCG/0.5 IM SUSY: 0.5000 mL | PREFILLED_SYRINGE | Freq: Once | INTRAMUSCULAR | Status: DC
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
## 2021-04-24 NOTE — ED Notes (Signed)
Pt says he is leaving- informed greeter of this. Pt ambulatory leaving ED lobby

## 2021-05-06 ENCOUNTER — Other Ambulatory Visit: Payer: Self-pay

## 2021-05-06 ENCOUNTER — Encounter (HOSPITAL_COMMUNITY): Payer: Self-pay | Admitting: Emergency Medicine

## 2021-05-06 ENCOUNTER — Emergency Department (HOSPITAL_COMMUNITY)
Admission: EM | Admit: 2021-05-06 | Discharge: 2021-05-06 | Disposition: A | Discharge status: Home / Self Care | Payer: Medicaid Other | Attending: Emergency Medicine | Admitting: Emergency Medicine

## 2021-05-06 DIAGNOSIS — Z79899 Other long term (current) drug therapy: Secondary | ICD-10-CM | POA: Diagnosis not present

## 2021-05-06 DIAGNOSIS — F840 Autistic disorder: Secondary | ICD-10-CM | POA: Insufficient documentation

## 2021-05-06 DIAGNOSIS — Z0000 Encounter for general adult medical examination without abnormal findings: Secondary | ICD-10-CM | POA: Diagnosis not present

## 2021-05-06 DIAGNOSIS — F17210 Nicotine dependence, cigarettes, uncomplicated: Secondary | ICD-10-CM | POA: Diagnosis not present

## 2021-05-06 LAB — CBC WITH DIFFERENTIAL/PLATELET
Abs Immature Granulocytes: 0.03 10*3/uL (ref 0.00–0.07)
Basophils Absolute: 0 10*3/uL (ref 0.0–0.1)
Basophils Relative: 0 %
Eosinophils Absolute: 0.8 10*3/uL — ABNORMAL HIGH (ref 0.0–0.5)
Eosinophils Relative: 8 %
HCT: 45.2 % (ref 39.0–52.0)
Hemoglobin: 15 g/dL (ref 13.0–17.0)
Immature Granulocytes: 0 %
Lymphocytes Relative: 28 %
Lymphs Abs: 2.7 10*3/uL (ref 0.7–4.0)
MCH: 30.5 pg (ref 26.0–34.0)
MCHC: 33.2 g/dL (ref 30.0–36.0)
MCV: 92.1 fL (ref 80.0–100.0)
Monocytes Absolute: 0.5 10*3/uL (ref 0.1–1.0)
Monocytes Relative: 5 %
Neutro Abs: 5.6 10*3/uL (ref 1.7–7.7)
Neutrophils Relative %: 59 %
Platelets: 172 10*3/uL (ref 150–400)
RBC: 4.91 MIL/uL (ref 4.22–5.81)
RDW: 13.8 % (ref 11.5–15.5)
WBC: 9.7 10*3/uL (ref 4.0–10.5)
nRBC: 0 % (ref 0.0–0.2)

## 2021-05-06 LAB — COMPREHENSIVE METABOLIC PANEL
ALT: 18 U/L (ref 0–44)
AST: 30 U/L (ref 15–41)
Albumin: 3.7 g/dL (ref 3.5–5.0)
Alkaline Phosphatase: 84 U/L (ref 38–126)
Anion gap: 6 (ref 5–15)
BUN: 11 mg/dL (ref 6–20)
CO2: 29 mmol/L (ref 22–32)
Calcium: 8.8 mg/dL — ABNORMAL LOW (ref 8.9–10.3)
Chloride: 104 mmol/L (ref 98–111)
Creatinine, Ser: 0.87 mg/dL (ref 0.61–1.24)
GFR, Estimated: >60 mL/min (ref >60)
Glucose, Bld: 97 mg/dL (ref 70–99)
Potassium: 4 mmol/L (ref 3.5–5.1)
Sodium: 139 mmol/L (ref 135–145)
Total Bilirubin: 0.4 mg/dL (ref 0.3–1.2)
Total Protein: 6.4 g/dL — ABNORMAL LOW (ref 6.5–8.1)

## 2021-05-06 LAB — ETHANOL: Alcohol, Ethyl (B): <10 mg/dL (ref <10)
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
## 2021-05-06 NOTE — Progress Notes (Signed)
TOC consulted for homelessness. CSW visited pt in ED to provide homelessness resources. Pt is agreeable to going to the [facility]. CSW set pt up with ride through [employer]. [facility] Neurosurgeon and emailed to transportation. TOC signing off.

## 2021-05-06 NOTE — Discharge Instructions (Addendum)
You are encouraged to follow up with [facility] for outpatient treatment. (Individual therapy, family therapy and medication management offered for youth and adults).  [facility] [address]  [place], [place] [address] Phone: [phone]  *   If you ever feel like you may hurt yourself or others or have thoughts about taking your own life, get help right away. To get help: Call your local emergency services (911 in the [place] Go to your nearest emergency department. Call a suicide hotline to speak with a trained counselor.   The following suicide hotlines are available in the [place]: 1-800-273-TALK 5164156278). 1-800-SUICIDE 603-523-1760). 3076102012. This is a hotline for Spanish speakers. (605) 282-6552. This is a hotline for TTY users. 1-866-4-U-TREVOR 318-411-2428). This is a hotline for lesbian, gay, bisexual, transgender, or questioning youth. www.thetrevorproject.org For a list of hotlines in [place], visit AmenCredit.is.html   Contact a crisis center or a local suicide prevention center. To find a crisis center or suicide prevention center: Call your local hospital, clinic, community service organization, mental health center, social service provider, or health department. Ask for help with connecting to a crisis center. For a list of crisis centers in the [place], visit: National Suicide Prevention Lifeline org.   Where to find more information: Hopeline: hopeline.com [employer] for Suicide Prevention: www.afsp.org

## 2021-07-14 ENCOUNTER — Encounter (HOSPITAL_COMMUNITY): Payer: Self-pay | Admitting: *Deleted

## 2021-07-14 ENCOUNTER — Emergency Department (HOSPITAL_COMMUNITY)
Admission: EM | Admit: 2021-07-14 | Discharge: 2021-07-14 | Disposition: A | Discharge status: Home / Self Care | Payer: Medicaid Other | Attending: Emergency Medicine | Admitting: Emergency Medicine

## 2021-07-14 ENCOUNTER — Other Ambulatory Visit: Payer: Self-pay

## 2021-07-14 ENCOUNTER — Emergency Department (HOSPITAL_COMMUNITY): Payer: Medicaid Other

## 2021-07-14 DIAGNOSIS — M25522 Pain in left elbow: Secondary | ICD-10-CM | POA: Insufficient documentation

## 2021-07-14 DIAGNOSIS — F17210 Nicotine dependence, cigarettes, uncomplicated: Secondary | ICD-10-CM | POA: Insufficient documentation

## 2021-07-14 MED ORDER — IBUPROFEN 800 MG PO TABS
800.0000 mg | ORAL_TABLET | Freq: Once | ORAL | Status: AC
  Administered 2021-07-14: 800 mg via ORAL
  Filled 2021-07-14: qty 1

## 2021-07-14 NOTE — Discharge Instructions (Addendum)
You were seen today for elbow pain.  Your x-rays are negative.  Take ibuprofen as needed for pain.
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
## 2022-07-12 ENCOUNTER — Encounter (HOSPITAL_COMMUNITY): Payer: Self-pay

## 2022-07-12 ENCOUNTER — Other Ambulatory Visit: Payer: Self-pay

## 2022-07-12 ENCOUNTER — Emergency Department (HOSPITAL_COMMUNITY)
Admission: EM | Admit: 2022-07-12 | Discharge: 2022-07-12 | Discharge status: Against Medical Advice | Payer: Medicaid Other | Attending: Emergency Medicine | Admitting: Emergency Medicine

## 2022-07-12 DIAGNOSIS — S90519A Abrasion, unspecified ankle, initial encounter: Secondary | ICD-10-CM | POA: Insufficient documentation

## 2022-07-12 DIAGNOSIS — W378XXA Explosion and rupture of other pressurized tire, pipe or hose, initial encounter: Secondary | ICD-10-CM | POA: Insufficient documentation

## 2022-07-12 DIAGNOSIS — Z5321 Procedure and treatment not carried out due to patient leaving prior to being seen by health care provider: Secondary | ICD-10-CM | POA: Insufficient documentation
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
## 2023-06-07 ENCOUNTER — Emergency Department (HOSPITAL_COMMUNITY)
Admission: EM | Admit: 2023-06-07 | Discharge: 2023-06-07 | Discharge status: Against Medical Advice | Payer: Self-pay | Attending: Emergency Medicine | Admitting: Emergency Medicine

## 2023-06-07 ENCOUNTER — Encounter (HOSPITAL_COMMUNITY): Payer: Self-pay | Admitting: Emergency Medicine

## 2023-06-07 ENCOUNTER — Other Ambulatory Visit: Payer: Self-pay

## 2023-06-07 DIAGNOSIS — R438 Other disturbances of smell and taste: Secondary | ICD-10-CM | POA: Insufficient documentation

## 2023-06-07 DIAGNOSIS — Z20822 Contact with and (suspected) exposure to covid-19: Secondary | ICD-10-CM | POA: Insufficient documentation

## 2023-06-07 DIAGNOSIS — Z5321 Procedure and treatment not carried out due to patient leaving prior to being seen by health care provider: Secondary | ICD-10-CM | POA: Insufficient documentation

## 2023-06-07 LAB — SARS CORONAVIRUS 2 BY RT PCR: SARS Coronavirus 2 by RT PCR: NEGATIVE

## 2023-06-07 NOTE — ED Triage Notes (Signed)
Pt requesting COVID test because family member tested positive. Pt states no taste x 2 days.

## 2024-01-13 ENCOUNTER — Other Ambulatory Visit: Payer: Self-pay

## 2024-01-13 ENCOUNTER — Emergency Department (HOSPITAL_COMMUNITY)
Admission: EM | Admit: 2024-01-13 | Discharge: 2024-01-13 | Disposition: A | Discharge status: Home / Self Care | Payer: Self-pay | Attending: Emergency Medicine | Admitting: Emergency Medicine

## 2024-01-13 ENCOUNTER — Emergency Department (HOSPITAL_COMMUNITY): Payer: Self-pay

## 2024-01-13 ENCOUNTER — Encounter (HOSPITAL_COMMUNITY): Payer: Self-pay | Admitting: *Deleted

## 2024-01-13 DIAGNOSIS — Y92410 Unspecified street and highway as the place of occurrence of the external cause: Secondary | ICD-10-CM | POA: Insufficient documentation

## 2024-01-13 DIAGNOSIS — M25561 Pain in right knee: Secondary | ICD-10-CM | POA: Insufficient documentation

## 2024-01-13 DIAGNOSIS — Z23 Encounter for immunization: Secondary | ICD-10-CM | POA: Insufficient documentation

## 2024-01-13 DIAGNOSIS — W19XXXA Unspecified fall, initial encounter: Secondary | ICD-10-CM

## 2024-01-13 MED ORDER — IBUPROFEN 800 MG PO TABS: 800.0000 mg | ORAL_TABLET | Freq: Three times a day (TID) | ORAL | 0 refills | Status: AC | PRN

## 2024-01-13 MED ORDER — TETANUS-DIPHTH-ACELL PERTUSSIS 5-2.5-18.5 LF-MCG/0.5 IM SUSY
0.5000 mL | PREFILLED_SYRINGE | Freq: Once | INTRAMUSCULAR | Status: AC
  Administered 2024-01-13: 0.5 mL via INTRAMUSCULAR
  Filled 2024-01-13: qty 0.5
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
## 2024-01-13 NOTE — Discharge Instructions (Signed)
 Follow-up with your family doctor if you have any problems with headaches or confusion.  Follow-up with orthopedic doctor for your knee if it does not improve
